# Patient Record
Sex: Female | Born: 1960 | Race: White | Hispanic: No | Marital: Married | Smoking: Former smoker
Health system: Southern US, Community
[De-identification: ages and names within clinical notes are randomized; demographics above are authoritative.]

## PROBLEM LIST (undated history)

## (undated) DIAGNOSIS — F32A Depression, unspecified: Secondary | ICD-10-CM

## (undated) DIAGNOSIS — E11319 Type 2 diabetes mellitus with unspecified diabetic retinopathy without macular edema: Secondary | ICD-10-CM

## (undated) DIAGNOSIS — E119 Type 2 diabetes mellitus without complications: Secondary | ICD-10-CM

## (undated) DIAGNOSIS — E118 Type 2 diabetes mellitus with unspecified complications: Secondary | ICD-10-CM

## (undated) DIAGNOSIS — E079 Disorder of thyroid, unspecified: Secondary | ICD-10-CM

## (undated) HISTORY — PX: FOOT SURGERY: SHX648

## (undated) HISTORY — DX: Type 2 diabetes mellitus with unspecified complications: E11.8

## (undated) HISTORY — DX: Type 2 diabetes mellitus without complications: E11.9

## (undated) HISTORY — DX: Type 2 diabetes mellitus with unspecified diabetic retinopathy without macular edema: E11.319

## (undated) HISTORY — DX: Disorder of thyroid, unspecified: E07.9

## (undated) HISTORY — DX: Depression, unspecified: F32.A

---

## 1999-02-18 ENCOUNTER — Encounter (INDEPENDENT_AMBULATORY_CARE_PROVIDER_SITE_OTHER): Payer: Self-pay

## 1999-02-18 ENCOUNTER — Ambulatory Visit (HOSPITAL_COMMUNITY): Admission: RE | Admit: 1999-02-18 | Discharge: 1999-02-18 | Payer: Self-pay | Admitting: *Deleted

## 2000-05-20 ENCOUNTER — Encounter (INDEPENDENT_AMBULATORY_CARE_PROVIDER_SITE_OTHER): Payer: Self-pay | Admitting: *Deleted

## 2000-05-20 ENCOUNTER — Other Ambulatory Visit: Admission: RE | Admit: 2000-05-20 | Discharge: 2000-05-20 | Payer: Self-pay | Admitting: *Deleted

## 2000-05-30 ENCOUNTER — Other Ambulatory Visit: Admission: RE | Admit: 2000-05-30 | Discharge: 2000-05-30 | Payer: Self-pay | Admitting: *Deleted

## 2000-07-28 ENCOUNTER — Encounter: Admission: RE | Admit: 2000-07-28 | Discharge: 2000-07-28 | Payer: Self-pay | Admitting: *Deleted

## 2000-10-18 ENCOUNTER — Encounter: Payer: Self-pay | Admitting: Internal Medicine

## 2000-10-18 ENCOUNTER — Encounter: Admission: RE | Admit: 2000-10-18 | Discharge: 2000-10-18 | Payer: Self-pay | Admitting: Internal Medicine

## 2003-11-08 ENCOUNTER — Encounter: Admission: RE | Admit: 2003-11-08 | Discharge: 2003-11-08 | Payer: Self-pay | Admitting: Internal Medicine

## 2004-10-06 ENCOUNTER — Ambulatory Visit (HOSPITAL_BASED_OUTPATIENT_CLINIC_OR_DEPARTMENT_OTHER): Admission: RE | Admit: 2004-10-06 | Discharge: 2004-10-06 | Payer: Self-pay | Admitting: Gynecology

## 2004-10-06 ENCOUNTER — Encounter (INDEPENDENT_AMBULATORY_CARE_PROVIDER_SITE_OTHER): Payer: Self-pay | Admitting: Specialist

## 2007-07-30 ENCOUNTER — Emergency Department (HOSPITAL_COMMUNITY): Admission: EM | Admit: 2007-07-30 | Discharge: 2007-07-30 | Payer: Self-pay | Admitting: Emergency Medicine

## 2009-07-10 ENCOUNTER — Emergency Department (HOSPITAL_COMMUNITY): Admission: EM | Admit: 2009-07-10 | Discharge: 2009-07-10 | Payer: Self-pay | Admitting: Family Medicine

## 2010-07-17 NOTE — H&P (Signed)
Kathy Ramirez, Kathy Ramirez                 ACCOUNT NO.:  [id number]   MEDICAL RECORD NO.:  [id number]          PATIENT TYPE:  AMB   LOCATION:  [facility]                         FACILITY:  [facility]   PHYSICIAN:  Timothy P. Fontaine, M.D.DATE OF BIRTH:  [birth year]   DATE OF ADMISSION:  10/06/2004  DATE OF DISCHARGE:                                HISTORY & PHYSICAL   She is being admitted for outpatient surgery, [facility],  August8, 2006 at 8:30.   CHIEF COMPLAINT:  Irregular heavy bleeding.   HISTORY OF PRESENT ILLNESS:  A 50 year old, G2, P2 female, status post tubal  sterilization, who was initially evaluated, having not been seen in the  office for over three years, due to heavy irregular bleeding.  The patient's  outpatient evaluation included a normal prolactin, FSH and TSH, negative  HCG, and a hemoglobin of 14.  Outpatient ultrasonography was suboptimal due  to both her bleeding as well as her abdominal girth.  Her endometrial stripe  was 13-mm.  Uterus overall appeared to be normal.  Right ovary is not  visualized, left was grossly normal with a thin physiologic 22-mm type cyst.  Due to her heavy bleeding, she was begun on Megace 40 mg b.i.d. and is  admitted for hysteroscopy D&C.  She apparently had an outpatient [place] for  endometrial sampling several years ago and it caused her incredible pain,  and she refuses any outpatient evaluation that requires endometrial  sampling.  She is being followed for diabetes and hypothyroidism.  She  recently was seen by her physician, Dr. Dimas Ramirez, who felt that she  was in poor control, but felt that it was appropriate to proceed with  hysteroscopy as control will not be predicted for some time, and her  endometrium needs to be evaluated.   PAST MEDICAL HISTORY:  1.  Diabetes.  2.  Hypothyroidism.   PAST SURGICAL HISTORY:  1.  Foot surgery.  2.  Cesarean section.  3.  BTL.   CURRENT MEDICATIONS:  1.  Synthroid 100  mcg daily.  2.  Glipizide/Metformin 2.5/500 daily.   ALLERGIES:  No reported medications.   REVIEW OF SYSTEMS:  Noncontributory.   FAMILY HISTORY:  Noncontributory.   SOCIAL HISTORY:  Noncontributory.   PHYSICAL EXAMINATION:  HEENT:  Normal.  LUNGS:  Clear.  CARDIAC:  Regular rate.  No rubs, murmurs, or gallops.  ABDOMEN:  Obese.  No gross palpable masses.  PELVIC:  External BUS, vagina normal. Cervix grossly normal.  Uterus without  gross abnormalities.  Adnexa without gross masses.  Limited by abdominal  girth.   ASSESSMENT/PLAN:  A 50 year old, G2, P2 female, status post tubal  sterilization with menometrorrhagia.  Ultrasound, uterus grossly normal,  endometrium at 13-mm, for hysteroscopy, endometrial sampling.   The patient is currently on Megace for menses control.  Currently being  followed for diabetes and hypothyroidism.  Her most recent labs show poor  diabetic control, elevated TSH, per Dr. Pincus Sanes evaluation.  He felt she  would be okay for hysteroscopy and we will plan on endometrial sampling to  rule out significant  hyperplasia or possible malignancy.  Labs were repeated  prior to surgery.  They are pending at the time of the dictation and we will  reevaluate the patient and re-discuss a.m. of surgery which will be  documented in a separate written statement the morning of surgery.       TPF/MEDQ  D:  10/02/2004  T:  10/02/2004  Job:  [id number]

## 2010-07-17 NOTE — Op Note (Signed)
NAMESERENNA, Kathy Ramirez                 ACCOUNT NO.:  [id number]   MEDICAL RECORD NO.:  [id number]          PATIENT TYPE:  AMB   LOCATION:  [facility]                         FACILITY:  [facility]   PHYSICIAN:  Timothy P. Fontaine, M.D.DATE OF BIRTH:  [birth year]   DATE OF PROCEDURE:  10/06/2004  DATE OF DISCHARGE:                                 OPERATIVE REPORT   PREOPERATIVE DIAGNOSES:  1.  Irregular bleeding.  2.  Menorrhagia.   POSTOPERATIVE DIAGNOSIS:  Endometrial hyperplasia, rule out carcinoma.   PROCEDURE:  Hysteroscopy D&C.   SURGEON:  Timothy P. Fontaine, M.D.   ANESTHESIA:  General.   ESTIMATED BLOOD LOSS:  Minimal.   COMPLICATIONS:  None.   SORBITOL DISCREPANCY:  Minimal.   SPECIMEN:  Endometrial curetting.   FINDINGS:  EUA without gross masses. Hysteroscopic shows a hyperplastic  pattern with multiple polyps. Endometrial overgrowth, bridging type of  growth, with grossly atypical vessels. Post D&C showed an empty cavity. No  remaining abnormalities. The tubal ostia not visualized bilaterally.  Otherwise, hysteroscopy was adequate noting fundus, anterior/posterior  surfaces, lower uterine segment, endocervical canal all visualized.   PROCEDURE:  The patient was taken to the operating room, underwent  anesthesia, placed in low dorsal lithotomy position, received a perineal  vaginal preparation with Betadine solution. Bladder emptied with in-and-out  Foley catheterization. EUA performed and the patient draped in the usual  fashion. Cervix was visualized with a weighted speculum. Anterior lip  grasped with single-tooth tenaculum and the cervix was gently gradually  dilated to admit the operative hysteroscope. Hysteroscopy was performed with  findings noted above. A sharp curettage was then performed with abundant  endometrium retrieved. Re-hysteroscopy showed an empty cavity. No remaining  abnormalities, good distention, no evidence of perforation. The instruments  were  removed. Adequate hemostasis was visualized. The patient placed in the  supine position, awakened without difficulty, and taken to recovery room in  good condition having tolerated the procedure well.       TPF/MEDQ  D:  10/06/2004  T:  10/06/2004  Job:  [id number]

## 2010-11-25 LAB — URINALYSIS, ROUTINE W REFLEX MICROSCOPIC
Ketones, ur: 15 — AB
Leukocytes, UA: NEGATIVE
Nitrite: NEGATIVE
Protein, ur: NEGATIVE
Urobilinogen, UA: 0.2

## 2010-12-01 ENCOUNTER — Other Ambulatory Visit: Payer: Self-pay | Admitting: Family Medicine

## 2010-12-01 DIAGNOSIS — R7989 Other specified abnormal findings of blood chemistry: Secondary | ICD-10-CM

## 2010-12-03 ENCOUNTER — Ambulatory Visit
Admission: RE | Admit: 2010-12-03 | Discharge: 2010-12-03 | Disposition: A | Discharge status: Home / Self Care | Payer: BC Managed Care – PPO | Source: Ambulatory Visit | Attending: Family Medicine | Admitting: Family Medicine

## 2010-12-03 DIAGNOSIS — R7989 Other specified abnormal findings of blood chemistry: Secondary | ICD-10-CM

## 2013-09-18 ENCOUNTER — Other Ambulatory Visit: Payer: Self-pay | Admitting: Physician Assistant

## 2013-09-18 DIAGNOSIS — R1012 Left upper quadrant pain: Secondary | ICD-10-CM

## 2013-09-21 ENCOUNTER — Ambulatory Visit
Admission: RE | Admit: 2013-09-21 | Discharge: 2013-09-21 | Disposition: A | Discharge status: Home / Self Care | Payer: BC Managed Care – PPO | Source: Ambulatory Visit | Attending: Physician Assistant | Admitting: Physician Assistant

## 2013-09-21 DIAGNOSIS — R1012 Left upper quadrant pain: Secondary | ICD-10-CM

## 2013-09-21 MED ORDER — IOHEXOL 300 MG/ML  SOLN
125.0000 mL | Freq: Once | INTRAMUSCULAR | Status: AC | PRN
  Administered 2013-09-21: 125 mL via INTRAVENOUS

## 2013-09-27 ENCOUNTER — Encounter (HOSPITAL_COMMUNITY): Payer: BC Managed Care – PPO

## 2013-12-26 ENCOUNTER — Other Ambulatory Visit: Payer: Self-pay | Admitting: Gastroenterology

## 2015-07-11 DIAGNOSIS — Z23 Encounter for immunization: Secondary | ICD-10-CM | POA: Diagnosis not present

## 2015-10-02 IMAGING — CT CT ABDOMEN W/ CM
2 of 5 series · 14 of 36 positions shown, 19 images · IV contrast (READICAT/WATER & [ID] OMNI 300)
Comparison: None.

CLINICAL DATA: Left upper quadrant pain for 6-8 weeks.

EXAM:
CT ABDOMEN WITH CONTRAST
TECHNIQUE: Multidetector CT imaging of the abdomen was performed using the
standard protocol following bolus administration of intravenous
contrast.
CONTRAST:  125mL OMNIPAQUE IOHEXOL 300 MG/ML  SOLN

[Series 601: coronal body · coronal · 0.92mm/px · 1 of 149 slices shown, 2 images]
[im 50/149  soft-tissue]
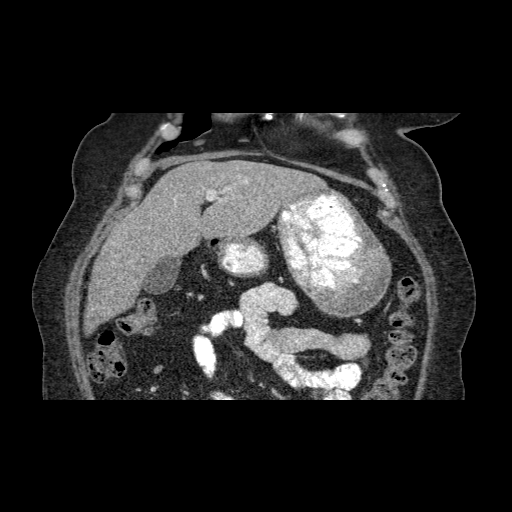
[im 50/149  bone]
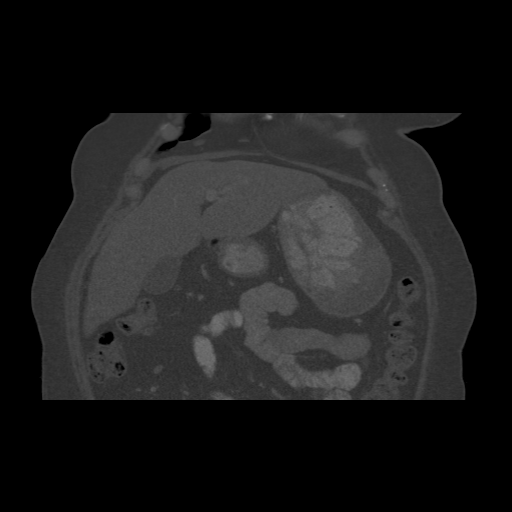

[Series 602: sagittal body · sagittal · 0.92mm/px · 13 of 180 slices shown, 17 images]
[im 10/180  soft-tissue]
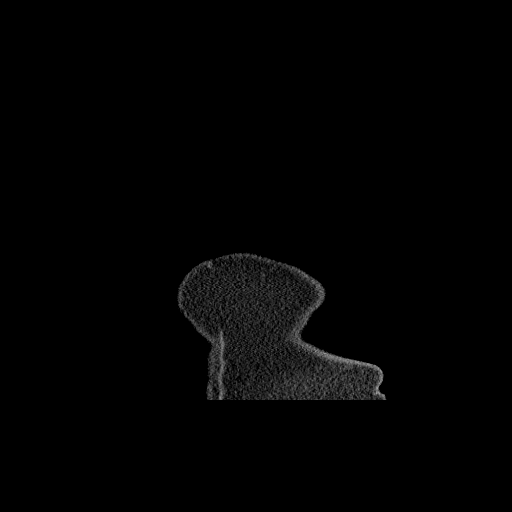
[im 10/180  lung]
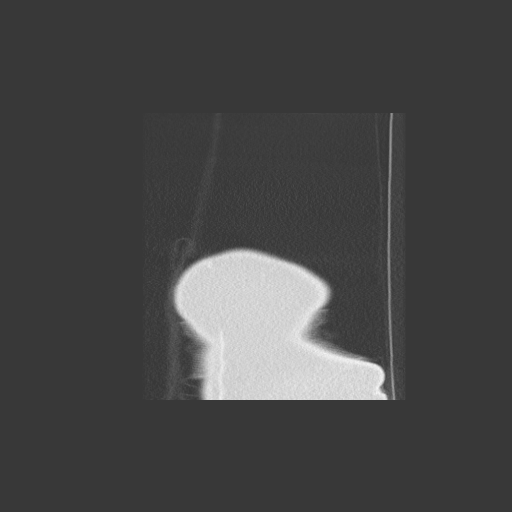
[im 10/180  bone]
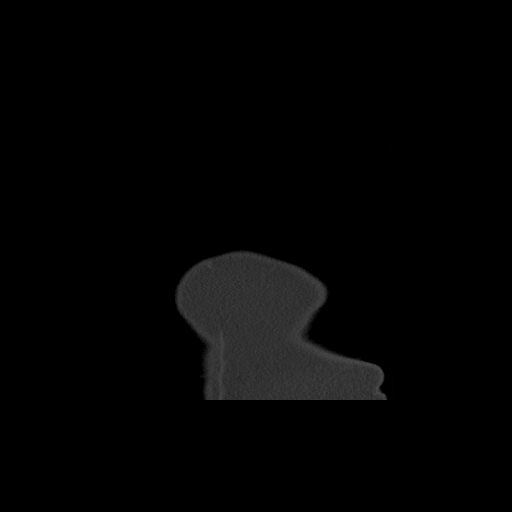
[im 19/180  lung]
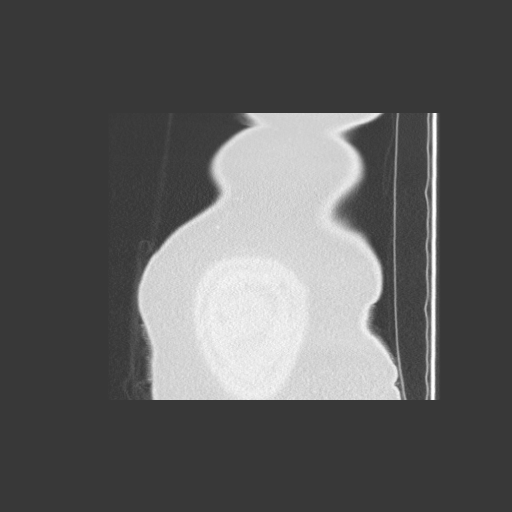
[im 29/180  soft-tissue]
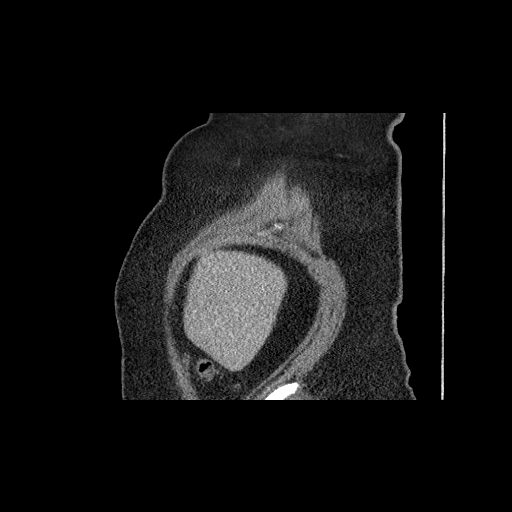
[im 29/180  lung]
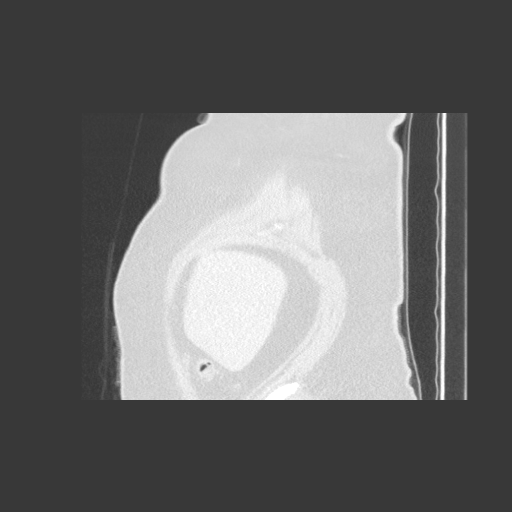
[im 38/180  lung]
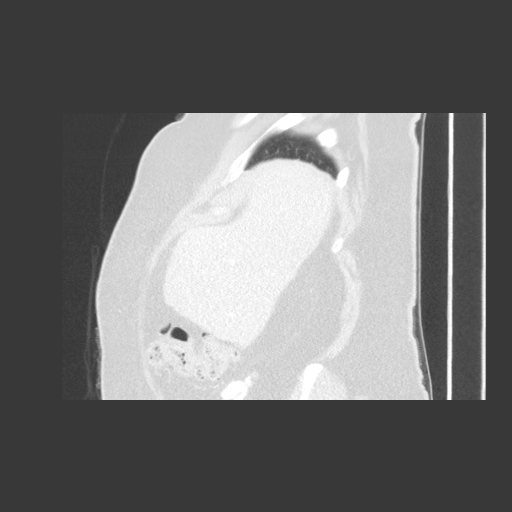
[im 48/180  soft-tissue]
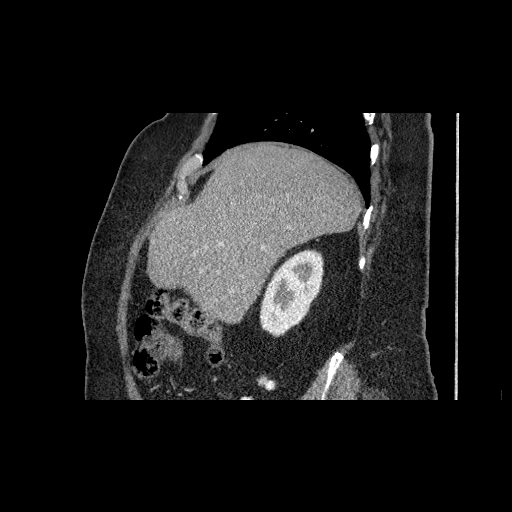
[im 57/180  soft-tissue]
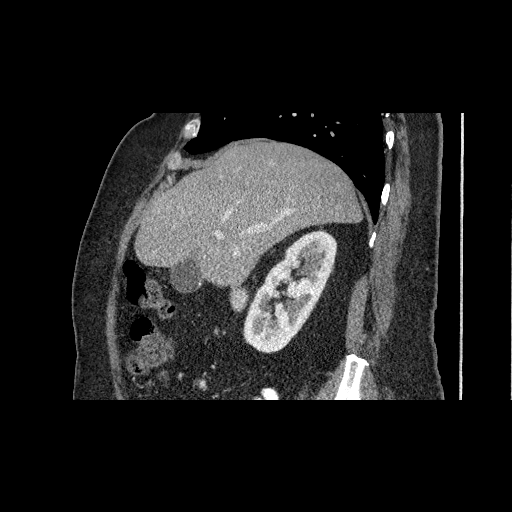
[im 76/180  soft-tissue]
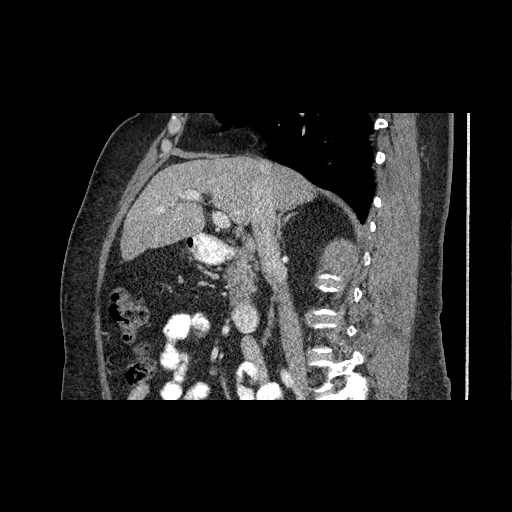
[im 95/180  soft-tissue]
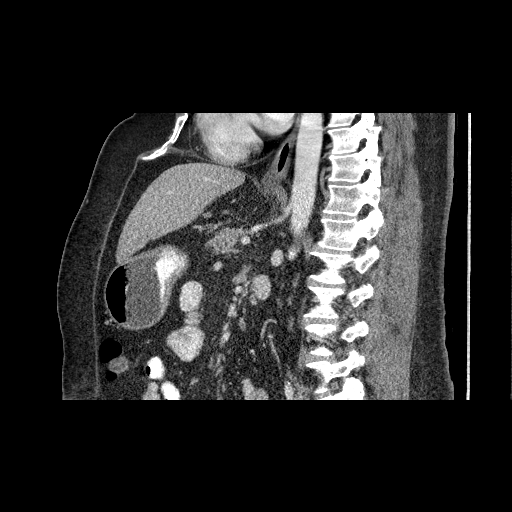
[im 104/180  soft-tissue]
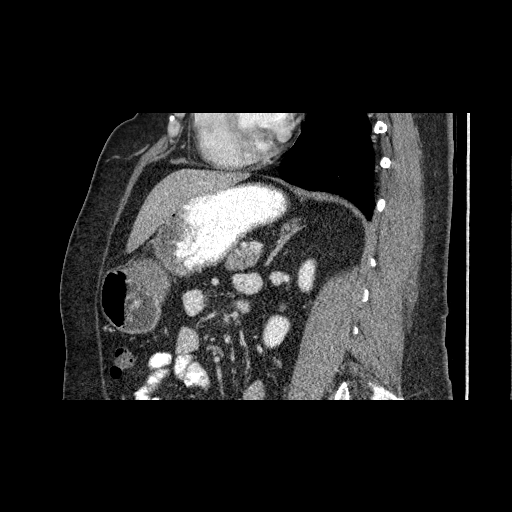
[im 123/180  soft-tissue]
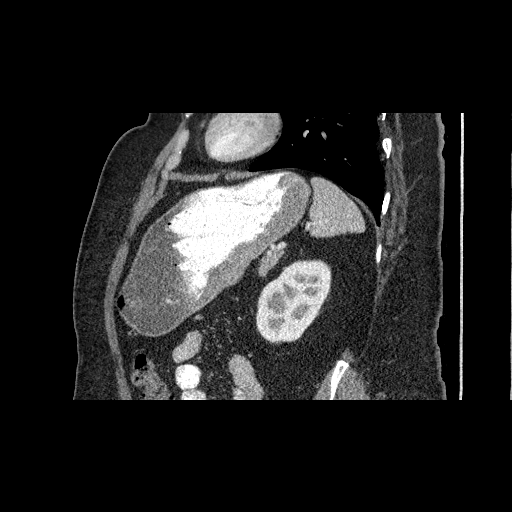
[im 132/180  soft-tissue]
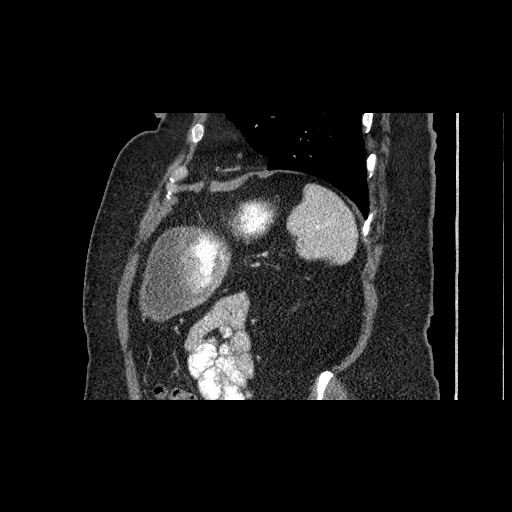
[im 132/180  bone]
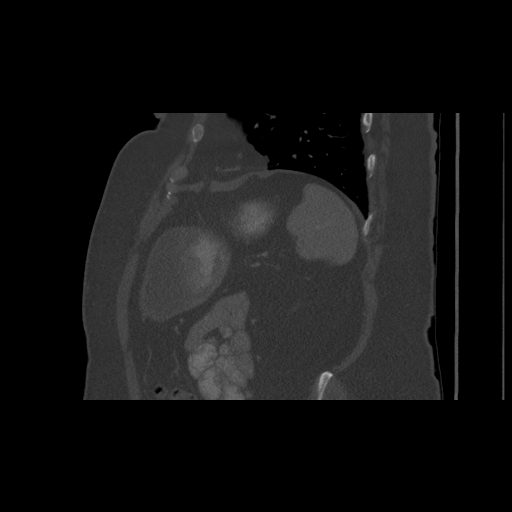
[im 151/180  soft-tissue]
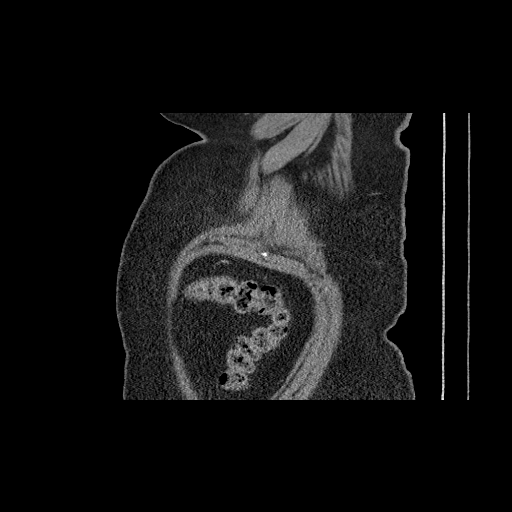
[im 170/180  soft-tissue]
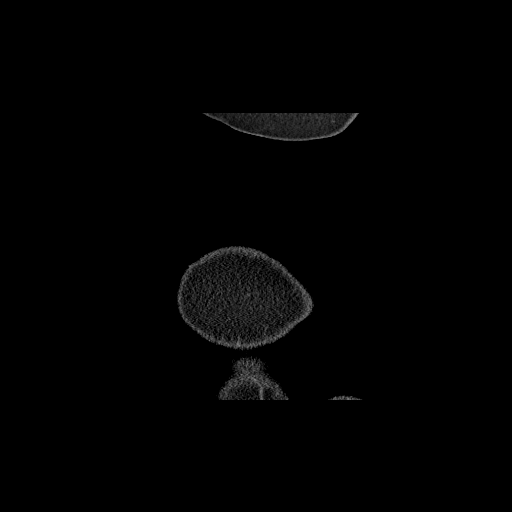

[14 of 36 positions shown; findings below may reference images not displayed]

FINDINGS: There is no pleural effusion.  The lung bases are clear.

No liver abnormality identified. Multiple small stones are
identified within the gallbladder. No biliary dilatation. There is a
normal appearance of the pancreas. The spleen is unremarkable.

Normal appearance of both adrenal glands. The kidneys both appear
normal.

Normal caliber of the abdominal aorta. There is mild calcified
atherosclerotic changes. No pathologically enlarged retroperitoneal
lymph nodes. No enlarged mesenteric lymph nodes.

No free fluid or fluid collections identified.

The stomach is normal. The small bowel loops have a normal course
and caliber.

No free fluid or fluid collections identified within the abdomen or
pelvis. The stomach appears normal. The small bowel loops have a
normal caliber there is no specific feature to suggest obstruction.
The visualized portions of the colon are normal.

Review of the visualized osseous structures is significant for mild
degenerative change within the lower thoracic and upper lumbar
spine.
IMPRESSION: 1. No findings within the upper abdomen to explain patient's left
upper quadrant pain.
2. Gallstones.

## 2016-05-17 DIAGNOSIS — E669 Obesity, unspecified: Secondary | ICD-10-CM | POA: Diagnosis not present

## 2016-05-17 DIAGNOSIS — E063 Autoimmune thyroiditis: Secondary | ICD-10-CM | POA: Diagnosis not present

## 2016-05-17 DIAGNOSIS — Z5181 Encounter for therapeutic drug level monitoring: Secondary | ICD-10-CM | POA: Diagnosis not present

## 2016-05-17 DIAGNOSIS — E785 Hyperlipidemia, unspecified: Secondary | ICD-10-CM | POA: Diagnosis not present

## 2016-05-17 DIAGNOSIS — E1142 Type 2 diabetes mellitus with diabetic polyneuropathy: Secondary | ICD-10-CM | POA: Diagnosis not present

## 2016-05-17 DIAGNOSIS — Z79899 Other long term (current) drug therapy: Secondary | ICD-10-CM | POA: Diagnosis not present

## 2016-05-17 DIAGNOSIS — E039 Hypothyroidism, unspecified: Secondary | ICD-10-CM | POA: Diagnosis not present

## 2016-05-31 ENCOUNTER — Ambulatory Visit: Payer: BLUE CROSS/BLUE SHIELD | Admitting: Rehabilitation

## 2016-06-24 DIAGNOSIS — E039 Hypothyroidism, unspecified: Secondary | ICD-10-CM | POA: Diagnosis not present

## 2016-06-24 DIAGNOSIS — E1142 Type 2 diabetes mellitus with diabetic polyneuropathy: Secondary | ICD-10-CM | POA: Diagnosis not present

## 2016-06-24 DIAGNOSIS — E063 Autoimmune thyroiditis: Secondary | ICD-10-CM | POA: Diagnosis not present

## 2016-06-24 DIAGNOSIS — E669 Obesity, unspecified: Secondary | ICD-10-CM | POA: Diagnosis not present

## 2016-08-25 DIAGNOSIS — E1142 Type 2 diabetes mellitus with diabetic polyneuropathy: Secondary | ICD-10-CM | POA: Diagnosis not present

## 2016-08-25 DIAGNOSIS — E063 Autoimmune thyroiditis: Secondary | ICD-10-CM | POA: Diagnosis not present

## 2016-08-25 DIAGNOSIS — E669 Obesity, unspecified: Secondary | ICD-10-CM | POA: Diagnosis not present

## 2016-08-25 DIAGNOSIS — E039 Hypothyroidism, unspecified: Secondary | ICD-10-CM | POA: Diagnosis not present

## 2016-08-27 ENCOUNTER — Ambulatory Visit (INDEPENDENT_AMBULATORY_CARE_PROVIDER_SITE_OTHER): Payer: BLUE CROSS/BLUE SHIELD

## 2016-08-27 ENCOUNTER — Encounter: Payer: Self-pay | Admitting: Podiatry

## 2016-08-27 ENCOUNTER — Ambulatory Visit (INDEPENDENT_AMBULATORY_CARE_PROVIDER_SITE_OTHER): Payer: BLUE CROSS/BLUE SHIELD | Admitting: Podiatry

## 2016-08-27 DIAGNOSIS — L03119 Cellulitis of unspecified part of limb: Secondary | ICD-10-CM

## 2016-08-27 DIAGNOSIS — L02619 Cutaneous abscess of unspecified foot: Secondary | ICD-10-CM

## 2016-08-27 DIAGNOSIS — R609 Edema, unspecified: Secondary | ICD-10-CM

## 2016-08-27 MED ORDER — CEPHALEXIN 500 MG PO CAPS: 500.0000 mg | ORAL_CAPSULE | Freq: Three times a day (TID) | ORAL | 1 refills | Status: DC

## 2016-08-27 NOTE — Progress Notes (Signed)
Subjective:    Patient ID: Kathy Ramirez, female   DOB: 56 y.o.   MRN: [id number]   HPI patient states that she's had discoloration in the plantar left ball for the last week and she does not remember specific injury and she does have diabetes that has been in reasonable control    Review of Systems  All other systems reviewed and are negative.       Objective:  Physical Exam  Constitutional: She is oriented to person, place, and time.  Cardiovascular: Intact distal pulses.   Musculoskeletal: Normal range of motion.  Neurological: She is alert and oriented to person, place, and time.  Skin: Skin is warm and dry.  Nursing note and vitals reviewed.  neurovascular status found to be intact muscle strength adequate range of motion within normal limits with patient noted to have keratotic lesion sub-left third metatarsal localized in nature with no proximal edema erythema or drainage noted. Patient does have diminishment of sharp [employer] vibratory and her sugars been running in the 180-200 range but she's not sure of her A1c     Assessment:   Localized infection of the left plantar foot with neuropathy and long-term diabetes is complicating factors      Plan:   H&P and x-rays of both feet reviewed. Patient has had previous elevating osteotomies but not I believe where she's having this problem and ultimately that might be necessary. At this point I recommended we focused on this area and using sharp sterile his mentation I debrided the tissue found to be localized with no drainage noted and I flushed the area and applied Iodosorb with sterile dressing and padding around it and I will see her back again in 1 week. Also placed her on cephalexin 500 mg 3 times a day I gave strict instructions if she should develop any systemic signs of infection or proximal redness she is to go straight to the emergency room  X-ray bilateral was negative for signs of ostial lysis with screws in the second fourth  metatarsal left and the lesser metatarsal right with no other pathology noted

## 2016-08-27 NOTE — Progress Notes (Signed)
   Subjective:    Patient ID: Kathy Ramirez, female    DOB: 12/18/1960, 56 y.o.   MRN: [id number]  HPI Chief Complaint  Patient presents with  . Diabetic Ulcer    Lt ball of the foot is painful/hard skin is sore and Rt foot have discoloration for about 1 week      Review of Systems  Musculoskeletal: Positive for back pain, gait problem and joint swelling.  Skin: Positive for color change.       Objective:   Physical Exam        Assessment & Plan:

## 2016-09-03 ENCOUNTER — Encounter: Payer: Self-pay | Admitting: Podiatry

## 2016-09-03 ENCOUNTER — Ambulatory Visit (INDEPENDENT_AMBULATORY_CARE_PROVIDER_SITE_OTHER): Payer: BLUE CROSS/BLUE SHIELD | Admitting: Podiatry

## 2016-09-03 DIAGNOSIS — L02619 Cutaneous abscess of unspecified foot: Secondary | ICD-10-CM | POA: Diagnosis not present

## 2016-09-03 DIAGNOSIS — E1149 Type 2 diabetes mellitus with other diabetic neurological complication: Secondary | ICD-10-CM | POA: Diagnosis not present

## 2016-09-03 DIAGNOSIS — E1140 Type 2 diabetes mellitus with diabetic neuropathy, unspecified: Secondary | ICD-10-CM | POA: Diagnosis not present

## 2016-09-03 DIAGNOSIS — L03119 Cellulitis of unspecified part of limb: Secondary | ICD-10-CM | POA: Diagnosis not present

## 2016-09-03 DIAGNOSIS — L97521 Non-pressure chronic ulcer of other part of left foot limited to breakdown of skin: Secondary | ICD-10-CM

## 2016-09-03 NOTE — Progress Notes (Signed)
Subjective:    Patient ID: Kathy Ramirez, female   DOB: 56 y.o.   MRN: [id number]   HPI patient states the left foot is doing better but the skin is still getting thicker and is still discolored but I'm not having the same amount of pain    ROS      Objective:  Physical Exam neuro status was found to be diminished with long-term diabetes with vascular status intact with severe keratotic lesion sub-third metatarsal left with slight drainage still noted but no proximal edema erythema noted     Assessment:    Doing much better having ulceration left plantar foot     Plan:    With sterile his mentation debrided the lesion completely with no drainage noted and I advised long-term that she would do best in the diabetic shoe given her advanced diabetes neuropathy and ulceration. Patient will be given approval for this and that hopefully will have diabetic shoes made

## 2016-10-06 ENCOUNTER — Ambulatory Visit: Payer: BLUE CROSS/BLUE SHIELD

## 2016-10-13 ENCOUNTER — Telehealth: Payer: Self-pay | Admitting: Podiatry

## 2016-10-13 NOTE — Telephone Encounter (Signed)
Pt called and is scheduled to see her pcp but asked if she needed to see her doctor that treats her diabetes.I explained that if Dr Sharl MaKerr treats her for her diabetes that he would have to sign off on the paperwork,Pt is scheduled to see him in sept and  is going to see if she can see him sooner and will let us know so we can fax paperwork to Dr Sharl MaKerr.

## 2016-10-15 DIAGNOSIS — E039 Hypothyroidism, unspecified: Secondary | ICD-10-CM | POA: Diagnosis not present

## 2016-10-18 ENCOUNTER — Other Ambulatory Visit: Payer: Self-pay | Admitting: Family Medicine

## 2016-10-18 DIAGNOSIS — I999 Unspecified disorder of circulatory system: Secondary | ICD-10-CM | POA: Diagnosis not present

## 2016-10-18 DIAGNOSIS — M21969 Unspecified acquired deformity of unspecified lower leg: Secondary | ICD-10-CM | POA: Diagnosis not present

## 2016-10-18 DIAGNOSIS — R1084 Generalized abdominal pain: Secondary | ICD-10-CM | POA: Diagnosis not present

## 2016-10-18 DIAGNOSIS — E1142 Type 2 diabetes mellitus with diabetic polyneuropathy: Secondary | ICD-10-CM | POA: Diagnosis not present

## 2016-10-19 ENCOUNTER — Telehealth: Payer: Self-pay | Admitting: Podiatry

## 2016-10-19 NOTE — Telephone Encounter (Signed)
Pt called and seen her pcp Dr Lupe Carney this week but when asking pt she stated Dr Talmage Coin is the doctor that treats her diabetes. She said she has seen him in the last couple of months. Can we please fax diabetic shoe paperwork to Dr Sharl Ma so pt can get her shoes.

## 2016-10-28 ENCOUNTER — Ambulatory Visit
Admission: RE | Admit: 2016-10-28 | Discharge: 2016-10-28 | Disposition: A | Discharge status: Home / Self Care | Payer: BLUE CROSS/BLUE SHIELD | Source: Ambulatory Visit | Attending: Family Medicine | Admitting: Family Medicine

## 2016-10-28 DIAGNOSIS — E11621 Type 2 diabetes mellitus with foot ulcer: Secondary | ICD-10-CM | POA: Diagnosis not present

## 2016-10-28 DIAGNOSIS — I999 Unspecified disorder of circulatory system: Secondary | ICD-10-CM

## 2016-10-29 ENCOUNTER — Encounter: Payer: Self-pay | Admitting: Podiatry

## 2016-10-29 ENCOUNTER — Other Ambulatory Visit: Payer: Self-pay | Admitting: Podiatry

## 2016-10-29 ENCOUNTER — Ambulatory Visit (INDEPENDENT_AMBULATORY_CARE_PROVIDER_SITE_OTHER): Payer: BLUE CROSS/BLUE SHIELD

## 2016-10-29 ENCOUNTER — Ambulatory Visit (INDEPENDENT_AMBULATORY_CARE_PROVIDER_SITE_OTHER): Payer: BLUE CROSS/BLUE SHIELD | Admitting: Podiatry

## 2016-10-29 DIAGNOSIS — L97521 Non-pressure chronic ulcer of other part of left foot limited to breakdown of skin: Secondary | ICD-10-CM | POA: Diagnosis not present

## 2016-10-29 DIAGNOSIS — M216X9 Other acquired deformities of unspecified foot: Secondary | ICD-10-CM

## 2016-10-29 DIAGNOSIS — M79672 Pain in left foot: Secondary | ICD-10-CM

## 2016-10-29 NOTE — Progress Notes (Signed)
Subjective:    Patient ID: Kathy Ramirez, female   DOB: 56 y.o.   MRN: [id number]   HPI patient presents stating this area has broken down again and I'm still waiting for my diabetic shoes    ROS      Objective:  Physical Exam neurovascular status unchanged with patient having reoccurrence of moderate ulceration plantar aspect left third metatarsal with localized redness no odor or active drainage noted. Patient is waiting for diabetic shoes which we will be able to modify     Assessment:   Reoccurrence of ulceration of the plantar left foot third metatarsal with the area being approximately 1 cm x 1 cm with 3 mm of depth      Plan:   Reviewed condition and using sterile sharp instrumentation debridement accomplished flushed the area and dispensed a surgical shoe with a wedge so there is no forefoot pressure and then discussed the diabetic shoes and the fact this may need to go to the OR and may eventually require elevating osteotomy or so may require a vigorous debridement  X-rays were negative for signs of osteomyelitis or soft tissue buckling and did indicate previous osteotomy second and fourth metatarsals

## 2016-12-13 ENCOUNTER — Other Ambulatory Visit: Payer: Self-pay | Admitting: Podiatry

## 2016-12-13 ENCOUNTER — Ambulatory Visit (INDEPENDENT_AMBULATORY_CARE_PROVIDER_SITE_OTHER): Payer: BLUE CROSS/BLUE SHIELD

## 2016-12-13 ENCOUNTER — Ambulatory Visit (INDEPENDENT_AMBULATORY_CARE_PROVIDER_SITE_OTHER): Payer: BLUE CROSS/BLUE SHIELD | Admitting: Podiatry

## 2016-12-13 DIAGNOSIS — L97522 Non-pressure chronic ulcer of other part of left foot with fat layer exposed: Secondary | ICD-10-CM | POA: Diagnosis not present

## 2016-12-13 DIAGNOSIS — E0842 Diabetes mellitus due to underlying condition with diabetic polyneuropathy: Secondary | ICD-10-CM

## 2016-12-13 DIAGNOSIS — L97529 Non-pressure chronic ulcer of other part of left foot with unspecified severity: Secondary | ICD-10-CM

## 2016-12-13 DIAGNOSIS — I70245 Atherosclerosis of native arteries of left leg with ulceration of other part of foot: Secondary | ICD-10-CM

## 2016-12-13 DIAGNOSIS — M79672 Pain in left foot: Secondary | ICD-10-CM

## 2016-12-13 MED ORDER — SULFAMETHOXAZOLE-TRIMETHOPRIM 800-160 MG PO TABS: 1.0000 | ORAL_TABLET | Freq: Two times a day (BID) | ORAL | 0 refills | Status: DC

## 2016-12-14 ENCOUNTER — Telehealth: Payer: Self-pay | Admitting: Podiatry

## 2016-12-14 MED ORDER — DOXYCYCLINE HYCLATE 100 MG PO CAPS
100.0000 mg | ORAL_CAPSULE | Freq: Two times a day (BID) | ORAL | 0 refills | Status: DC
Start: 2016-12-14 — End: 2017-03-23

## 2016-12-14 NOTE — Telephone Encounter (Signed)
I told pt that her symptoms may not be related to the antibiotic, that she may be having a heart problem and she should go to the ED or call her PCP. Pt stated she would call her PCP. I told her I would contact Dr.Evans for alternate antibiotic and explain what is going on with pt currently.

## 2016-12-14 NOTE — Addendum Note (Signed)
Addended by: Alphia Kava D on: 12/14/2016 01:57 PM   Modules accepted: Orders

## 2016-12-14 NOTE — Telephone Encounter (Signed)
I called new antibiotic orders doxycycline to pt and she stated she was waiting for a call back from her PCP.

## 2016-12-14 NOTE — Telephone Encounter (Signed)
I saw Dr. Logan Bores yesterday and he prescribed me a high power antibiotic to take twice daily. I took it after dinner last night but it has made me feel funny and I think I might need a different antibiotic. It caused me to have a bad headache, and my heart is beating slightly fast but feels like it was pounding in my chest all night, which caused me to wake up about every hour. I almost have a sick feeling, like I could throw up. Please call me back at [phone].

## 2016-12-14 NOTE — Telephone Encounter (Signed)
Discontinue the Bactrim. Rx for doxycycline100mg  #20 BID. Thanks, Dr. Logan Bores

## 2016-12-15 NOTE — Progress Notes (Signed)
   Subjective:  Patient with a history of diabetes mellitus presents today for follow-up evaluation of an ulceration to the left foot secondary to diabetes mellitus. She reports significant pain and states there appears to be blood underneath the area. She reports associated swelling. She is concerned for infection. She denies any modifying factors.  Chest reports a new complaint of swelling and a rash to the left lower leg. She has not done anything to treat the area. Patient presents today for further treatment and evaluation.   Past Medical History:  Diagnosis Date  . Diabetes mellitus without complication (HCC)       Objective/Physical Exam General: The patient is alert and oriented x3 in no acute distress.  Dermatology:  Wound #1 noted to the left foot measuring 2.5 x 2.0 x 0.3 cm (LxWxD).   To the noted ulceration(s), there is no eschar. There is a moderate amount of slough, fibrin, and necrotic tissue noted. Granulation tissue and wound base is red. There is a minimal amount of serosanguineous drainage noted. There is no exposed bone muscle-tendon ligament or joint. There is no malodor. Periwound integrity is intact. Skin is warm, dry and supple bilateral lower extremities.  Vascular: Palpable pedal pulses bilaterally. No edema or erythema noted. Capillary refill within normal limits.  Neurological: Epicritic and protective threshold absent bilaterally.   Musculoskeletal Exam: Range of motion within normal limits to all pedal and ankle joints bilateral. Muscle strength 5/5 in all groups bilateral.   Assessment: #1 ulceration of the left foot secondary to diabetes mellitus #2 diabetes mellitus w/ peripheral neuropathy   Plan of Care:  #1 Patient was evaluated. #2 medically necessary excisional debridement including muscle and deep fascial tissue was performed using a tissue nipper and a chisel blade. Excisional debridement of all the necrotic nonviable tissue down to healthy  bleeding viable tissue was performed with post-debridement measurements same as pre-. #3 The wound was cleansed and dry sterile dressing applied. #4 culture taken from the wound. Prescription for Bactrim DS given to patient. #5 recommend an iodine and dry sterile dressing daily. #6 patient is getting diabetic shoes with Raiford Nobleick, Pedorthist. #7 return to clinic in 3 weeks with Dr. Charlsie Merlesegal.   Felecia ShellingBrent M. Sihaam Chrobak, DPM [facility]  Dr. Felecia ShellingBrent M. Vinette Crites, DPM    [address]                                        [place], [place] [address]                Office [phone]  Fax [phone]

## 2016-12-16 ENCOUNTER — Telehealth: Payer: Self-pay | Admitting: Podiatry

## 2016-12-16 LAB — WOUND CULTURE
MICRO NUMBER:: 81146646
SPECIMEN QUALITY: ADEQUATE

## 2016-12-16 NOTE — Telephone Encounter (Signed)
Entered in error

## 2016-12-27 ENCOUNTER — Ambulatory Visit: Payer: BLUE CROSS/BLUE SHIELD | Admitting: Orthotics

## 2016-12-27 DIAGNOSIS — M79673 Pain in unspecified foot: Secondary | ICD-10-CM

## 2016-12-27 DIAGNOSIS — I70245 Atherosclerosis of native arteries of left leg with ulceration of other part of foot: Secondary | ICD-10-CM

## 2016-12-27 DIAGNOSIS — L97522 Non-pressure chronic ulcer of other part of left foot with fat layer exposed: Secondary | ICD-10-CM

## 2016-12-27 DIAGNOSIS — E0842 Diabetes mellitus due to underlying condition with diabetic polyneuropathy: Secondary | ICD-10-CM

## 2016-12-27 NOTE — Progress Notes (Signed)
Patient came in today to pick up diabetic shoes and custom inserts.  Same was well pleased with fit and function.   The patient could ambulate without any discomfort; there were no signs of any quality issues. The foot ortheses offered full contact with plantar surface and contoured the arch well.   The shoes fit well with no heel slippage and areas of pressure concern.   Patient advised to contact us if any problems arise.  Patient also advised on how to report any issues.   Cash price due to no coverage insurance:  $250

## 2017-01-05 ENCOUNTER — Ambulatory Visit (INDEPENDENT_AMBULATORY_CARE_PROVIDER_SITE_OTHER): Payer: BLUE CROSS/BLUE SHIELD | Admitting: Podiatry

## 2017-01-05 ENCOUNTER — Ambulatory Visit: Payer: BLUE CROSS/BLUE SHIELD | Admitting: Orthotics

## 2017-01-05 ENCOUNTER — Encounter: Payer: Self-pay | Admitting: Podiatry

## 2017-01-05 DIAGNOSIS — I70245 Atherosclerosis of native arteries of left leg with ulceration of other part of foot: Secondary | ICD-10-CM

## 2017-01-05 DIAGNOSIS — E0842 Diabetes mellitus due to underlying condition with diabetic polyneuropathy: Secondary | ICD-10-CM

## 2017-01-05 DIAGNOSIS — M79672 Pain in left foot: Secondary | ICD-10-CM

## 2017-01-05 DIAGNOSIS — L97521 Non-pressure chronic ulcer of other part of left foot limited to breakdown of skin: Secondary | ICD-10-CM

## 2017-01-05 DIAGNOSIS — L97522 Non-pressure chronic ulcer of other part of left foot with fat layer exposed: Secondary | ICD-10-CM

## 2017-01-05 DIAGNOSIS — L03119 Cellulitis of unspecified part of limb: Secondary | ICD-10-CM

## 2017-01-05 DIAGNOSIS — L02619 Cutaneous abscess of unspecified foot: Secondary | ICD-10-CM

## 2017-01-05 NOTE — Patient Instructions (Signed)
Instructions for Wound Care  The most important step to healing a foot wound is to reduce the pressure on your foot - it is extremely important to stay off your foot as much as possible and wear the shoe/boot as instructed.  Cleanse your foot with saline wash or warm soapy water (dial antibacterial soap or similar).  Blot dry.  Apply prescribed medication to your wound and cover with gauze and a bandage.  May hold bandage in place with Coban (self sticky wrap), Ace bandage or tape.  You may find dressing supplies at your local [employer], [employer], drug store or medical supply store.  Your prescribed topical medication is :    [employer] is a mail order medical supply company that we use to provide some of our would care products.  If we use their service of you, you will receive the product by mail.  If you have not received the medication in 3 business days, please call our office.  If you notice any foul odor, increase in pain, pus, increased swelling, red streaks or generalized redness occurring in your foot or leg-Call our office immediately to be seen.  This may be a sign of a limb or life threatening infection that will need prompt attention.  Marlowe AschoffKathryn Egerton, DPM  [facility]  [phone] [place]  [phone] [place]

## 2017-01-05 NOTE — Progress Notes (Signed)
Reordered shoes, patient complaint was that her edema caused discomfort in mary jane style... Reordered apex two strap velco.

## 2017-01-07 NOTE — Progress Notes (Signed)
Subjective:    Patient ID: Rolm BookbinderJoli T Ekstrom, female   DOB: [birth year] y.o.   MRN: [id number]   HPI patient states she seems to be improved quite a bit and has been evaluated for diabetic shoes    ROS      Objective:  Physical Exam neurovascular status unchanged with keratotic lesion subsecond fourth metatarsal right localized in nature with no active drainage noted     Assessment:   Ulcer the right plantar foot that is more superficial it was previously with no indications currently of fat exposure      Plan:    Flushed the area out applied medication debrided tissue and instructed on soaks and continued padding therapy. Patient be seen back to recheck and earlier if any redness drainage or other changes in the plantar site should occur

## 2017-01-17 ENCOUNTER — Ambulatory Visit: Payer: BLUE CROSS/BLUE SHIELD | Admitting: Orthotics

## 2017-03-23 ENCOUNTER — Ambulatory Visit: Payer: BLUE CROSS/BLUE SHIELD | Admitting: Podiatry

## 2017-03-23 ENCOUNTER — Encounter: Payer: Self-pay | Admitting: Podiatry

## 2017-03-23 DIAGNOSIS — L97522 Non-pressure chronic ulcer of other part of left foot with fat layer exposed: Secondary | ICD-10-CM | POA: Diagnosis not present

## 2017-03-23 DIAGNOSIS — E0843 Diabetes mellitus due to underlying condition with diabetic autonomic (poly)neuropathy: Secondary | ICD-10-CM

## 2017-03-23 DIAGNOSIS — I70245 Atherosclerosis of native arteries of left leg with ulceration of other part of foot: Secondary | ICD-10-CM

## 2017-03-23 MED ORDER — DOXYCYCLINE HYCLATE 100 MG PO CAPS: 100.0000 mg | ORAL_CAPSULE | Freq: Two times a day (BID) | ORAL | 0 refills | Status: DC

## 2017-03-23 MED ORDER — GENTAMICIN SULFATE 0.1 % EX CREA: 1.0000 "application " | TOPICAL_CREAM | Freq: Three times a day (TID) | CUTANEOUS | 1 refills | Status: DC

## 2017-03-26 LAB — WOUND CULTURE
MICRO NUMBER:: 90096691
SPECIMEN QUALITY:: ADEQUATE

## 2017-03-26 NOTE — Progress Notes (Signed)
   Subjective:  Patient with a history of diabetes mellitus presents today for evaluation of a worsening callus lesion of the left forefoot that has been present for a few weeks. She reports associated pain and states there is blood collecting underneath the callus. There are no modifying factors noted. Patient presents today for further treatment and evaluation.   Past Medical History:  Diagnosis Date  . Diabetes mellitus without complication (HCC)       Objective/Physical Exam General: The patient is alert and oriented x3 in no acute distress.  Dermatology:  Wound #1 noted to the left foot measuring 1.5 x 1.0 x 0.2 cm (LxWxD).   To the noted ulceration(s), there is no eschar. There is a moderate amount of slough, fibrin, and necrotic tissue noted. Granulation tissue and wound base is red. There is a minimal amount of serosanguineous drainage noted. There is no exposed bone muscle-tendon ligament or joint. There is moderate malodor. Periwound is callused. Skin is warm, dry and supple bilateral lower extremities.  Vascular: Palpable pedal pulses bilaterally. No edema or erythema noted. Capillary refill within normal limits.  Neurological: Epicritic and protective threshold absent bilaterally.   Musculoskeletal Exam: Range of motion within normal limits to all pedal and ankle joints bilateral. Muscle strength 5/5 in all groups bilateral.   Assessment: #1 ulceration to left foot secondary to diabetes mellitus #2 diabetes mellitus w/ peripheral neuropathy   Plan of Care:  #1 Patient was evaluated. #2 medically necessary excisional debridement including subcutaneous tissue was performed using a tissue nipper and a chisel blade. Excisional debridement of all the necrotic nonviable tissue down to healthy bleeding viable tissue was performed with post-debridement measurements same as pre-. #3 the wound was cleansed and dry sterile dressing applied. #4 Prescription for gentamicin cream  provided to patient. #5 Prescription for Doxycycline #28 provided to patient.  #6 Cultures taken from wound. #7 Post op shoe dispensed. Weightbearing as tolerated. #8 Orders for HgA1c placed today. Patient needs to follow up with Dr. Sharl MaKerr, PCP. #9 Return to clinic in 3 weeks.   Felecia ShellingBrent M. Kaige Whistler, DPM [facility]  Dr. Felecia ShellingBrent M. Eduard Penkala, DPM    [address]                                        [place], [place] [address]                Office [phone]  Fax [phone]

## 2017-04-07 DIAGNOSIS — E0843 Diabetes mellitus due to underlying condition with diabetic autonomic (poly)neuropathy: Secondary | ICD-10-CM | POA: Diagnosis not present

## 2017-04-07 DIAGNOSIS — I70245 Atherosclerosis of native arteries of left leg with ulceration of other part of foot: Secondary | ICD-10-CM | POA: Diagnosis not present

## 2017-04-08 LAB — HEMOGLOBIN A1C
EAG (MMOL/L): 17 (calc)
Hgb A1c MFr Bld: 12.3 % of total Hgb — ABNORMAL HIGH (ref <5.7)
MEAN PLASMA GLUCOSE: 306 (calc)

## 2017-04-18 ENCOUNTER — Telehealth: Payer: Self-pay | Admitting: Podiatry

## 2017-04-18 ENCOUNTER — Ambulatory Visit (INDEPENDENT_AMBULATORY_CARE_PROVIDER_SITE_OTHER): Payer: BLUE CROSS/BLUE SHIELD | Admitting: Podiatry

## 2017-04-18 DIAGNOSIS — I70245 Atherosclerosis of native arteries of left leg with ulceration of other part of foot: Secondary | ICD-10-CM

## 2017-04-18 DIAGNOSIS — L97522 Non-pressure chronic ulcer of other part of left foot with fat layer exposed: Secondary | ICD-10-CM

## 2017-04-18 DIAGNOSIS — E0843 Diabetes mellitus due to underlying condition with diabetic autonomic (poly)neuropathy: Secondary | ICD-10-CM | POA: Diagnosis not present

## 2017-04-18 MED ORDER — GABAPENTIN 100 MG PO CAPS: 100.0000 mg | ORAL_CAPSULE | Freq: Three times a day (TID) | ORAL | 3 refills | Status: DC

## 2017-04-18 NOTE — Telephone Encounter (Signed)
Gabapentin 100mg . I just sent it in. Thanks, Dr. Logan BoresEvans

## 2017-04-18 NOTE — Telephone Encounter (Signed)
I was there this morning and saw Dr. Logan BoresEvans. He stated while I was there that he was going to prescribe a nerve medication for me. [facility] and they did not get. I didn't know if he remembered to do that or not. If you want to give me a call back on it I sure would appreciate it. My number is [phone]. Thank you very much and have a wonderful day.

## 2017-04-18 NOTE — Telephone Encounter (Signed)
Dr.Evans, what medication did you want to prescribe?

## 2017-04-19 ENCOUNTER — Telehealth: Payer: Self-pay | Admitting: *Deleted

## 2017-04-19 NOTE — Telephone Encounter (Signed)
Pt states the prescription Dr. Logan BoresEvans was to send to the pharmacy is not there.

## 2017-04-19 NOTE — Telephone Encounter (Signed)
I informed pt the Gabapentin was confirmed received by [facility] 04/18/2017 at 5:41pm.

## 2017-04-20 NOTE — Progress Notes (Signed)
   Subjective:  Patient with a history of diabetes mellitus presents today for follow up evaluation of a painful ulceration to the left foot. She reports associated drainage and states a callus has formed over the area. She has been using gentamicin cream daily as directed. Patient presents today for further treatment and evaluation.   Past Medical History:  Diagnosis Date  . Diabetes mellitus without complication (HCC)       Objective/Physical Exam General: The patient is alert and oriented x3 in no acute distress.  Dermatology:  Wound #1 noted to the left foot measuring 1.0 x 1.0 x 0.2 cm (LxWxD).   To the noted ulceration(s), there is no eschar. There is a moderate amount of slough, fibrin, and necrotic tissue noted. Granulation tissue and wound base is red. There is a minimal amount of serosanguineous drainage noted. There is no exposed bone muscle-tendon ligament or joint. There is moderate malodor. Periwound is callused. Skin is warm, dry and supple bilateral lower extremities.  Vascular: Palpable pedal pulses bilaterally. No edema or erythema noted. Capillary refill within normal limits.  Neurological: Epicritic and protective threshold absent bilaterally.   Musculoskeletal Exam: Range of motion within normal limits to all pedal and ankle joints bilateral. Muscle strength 5/5 in all groups bilateral.   Assessment: #1 ulceration to left foot secondary to diabetes mellitus #2 diabetes mellitus w/ peripheral neuropathy   Plan of Care:  #1 Patient was evaluated. Cultures reviewed.  #2 medically necessary excisional debridement including subcutaneous tissue was performed using a tissue nipper and a chisel blade. Excisional debridement of all the necrotic nonviable tissue down to healthy bleeding viable tissue was performed with post-debridement measurements same as pre-. #3 the wound was cleansed and dry sterile dressing applied. #4 Continue using gentamicin cream daily with a  dry sterile dressing. #5 Continue wearing post op shoe.  #6 Prescription for gabapentin 100 mg to be taken at bedtime provided to patient.  #7 Spoke with patient regarding elevated A1c levels (12.3 mg/dL). Stressed importance of decreasing sugar levels.  #8 Return to clinic in 3 weeks.   Felecia ShellingBrent M. Evans, DPM [facility]  Dr. Felecia ShellingBrent M. Evans, DPM    [address]                                        [place], [place] [address]                Office [phone]  Fax [phone]

## 2017-05-09 ENCOUNTER — Ambulatory Visit: Payer: BLUE CROSS/BLUE SHIELD | Admitting: Podiatry

## 2017-06-01 ENCOUNTER — Ambulatory Visit (INDEPENDENT_AMBULATORY_CARE_PROVIDER_SITE_OTHER): Payer: BLUE CROSS/BLUE SHIELD | Admitting: Podiatry

## 2017-06-01 DIAGNOSIS — B351 Tinea unguium: Secondary | ICD-10-CM

## 2017-06-01 DIAGNOSIS — L97522 Non-pressure chronic ulcer of other part of left foot with fat layer exposed: Secondary | ICD-10-CM

## 2017-06-01 DIAGNOSIS — M79676 Pain in unspecified toe(s): Secondary | ICD-10-CM

## 2017-06-01 DIAGNOSIS — E0843 Diabetes mellitus due to underlying condition with diabetic autonomic (poly)neuropathy: Secondary | ICD-10-CM | POA: Diagnosis not present

## 2017-06-01 DIAGNOSIS — I70245 Atherosclerosis of native arteries of left leg with ulceration of other part of foot: Secondary | ICD-10-CM

## 2017-06-03 NOTE — Progress Notes (Signed)
   Subjective:  Patient with a history of diabetes mellitus presents today for follow up evaluation of an ulceration of the left foot. She states the wound has worsened since her last visit. She has been using the gentamicin cream daily as directed with a dry sterile dressing.  Patient also complains of elongated, thickened nails of bilateral feet that cause pain while ambulating in shoes. She is unable to trim her own nails. Patient presents today for further treatment and evaluation.   Past Medical History:  Diagnosis Date  . Diabetes mellitus without complication (HCC)       Objective/Physical Exam General: The patient is alert and oriented x3 in no acute distress.  Dermatology:  Wound #1 noted to the left foot measuring 1.2 x 0.7 x 0.2 cm (LxWxD).   To the noted ulceration(s), there is no eschar. There is a moderate amount of slough, fibrin, and necrotic tissue noted. Granulation tissue and wound base is red. There is a minimal amount of serosanguineous drainage noted. There is no exposed bone muscle-tendon ligament or joint. There is moderate malodor. Periwound is callused. Nails are tender, long, thickened and dystrophic with subungual debris, consistent with onychomycosis, 1-5 bilateral. No signs of infection noted. Skin is warm, dry and supple bilateral lower extremities.  Vascular: Palpable pedal pulses bilaterally. No edema or erythema noted. Capillary refill within normal limits.  Neurological: Epicritic and protective threshold absent bilaterally.   Musculoskeletal Exam: Range of motion within normal limits to all pedal and ankle joints bilateral. Muscle strength 5/5 in all groups bilateral.   Assessment: #1 ulceration to left foot secondary to diabetes mellitus #2 diabetes mellitus w/ peripheral neuropathy #3 Onychomycosis of nail due to dermatophyte bilateral  Plan of Care:  #1 Patient was evaluated.  #2 Medically necessary excisional debridement including subcutaneous  tissue was performed using a tissue nipper and a chisel blade. Excisional debridement of all the necrotic nonviable tissue down to healthy bleeding viable tissue was performed with post-debridement measurements same as pre-. #3 The wound was cleansed and dry sterile dressing applied. #4 Continue using gentamicin cream daily with a dry sterile dressing.  #5 Continue wearing post op shoe.  #6 Instructed to maintain good pedal hygiene and foot care. Stressed importance of controlling blood sugar.  #7 Mechanical debridement of nails 1-5 bilaterally performed using a nail nipper. Filed with dremel without incident.  #8 Return to clinic in 6 weeks.    Felecia ShellingBrent M. Lexton Hidalgo, DPM [facility]  Dr. Felecia ShellingBrent M. Brenee Gajda, DPM    [address]                                        [place], [place] [address]                Office [phone]) [phone]  Fax [phone](336) [phone]

## 2017-07-08 ENCOUNTER — Ambulatory Visit (HOSPITAL_COMMUNITY)
Admission: EM | Admit: 2017-07-08 | Discharge: 2017-07-08 | Disposition: A | Discharge status: Home / Self Care | Payer: BLUE CROSS/BLUE SHIELD | Attending: Family Medicine | Admitting: Family Medicine

## 2017-07-08 ENCOUNTER — Encounter (HOSPITAL_COMMUNITY): Payer: Self-pay | Admitting: Family Medicine

## 2017-07-08 DIAGNOSIS — B9789 Other viral agents as the cause of diseases classified elsewhere: Secondary | ICD-10-CM

## 2017-07-08 DIAGNOSIS — J069 Acute upper respiratory infection, unspecified: Secondary | ICD-10-CM | POA: Diagnosis not present

## 2017-07-08 MED ORDER — IPRATROPIUM BROMIDE 0.06 % NA SOLN: 2.0000 | Freq: Four times a day (QID) | NASAL | 0 refills | Status: AC

## 2017-07-08 MED ORDER — TRIAMCINOLONE ACETONIDE 55 MCG/ACT NA AERO: 2.0000 | INHALATION_SPRAY | Freq: Every day | NASAL | 12 refills | Status: DC

## 2017-07-08 MED ORDER — DOXYCYCLINE HYCLATE 100 MG PO CAPS: 100.0000 mg | ORAL_CAPSULE | Freq: Two times a day (BID) | ORAL | 0 refills | Status: DC

## 2017-07-08 MED ORDER — BENZONATATE 100 MG PO CAPS: 100.0000 mg | ORAL_CAPSULE | Freq: Three times a day (TID) | ORAL | 0 refills | Status: DC

## 2017-07-08 MED ORDER — CETIRIZINE HCL 10 MG PO TABS: 10.0000 mg | ORAL_TABLET | Freq: Every day | ORAL | 0 refills | Status: DC

## 2017-07-08 NOTE — ED Provider Notes (Signed)
[facility]    CSN: [id number] Arrival date & time: 07/08/17  1320     History   Chief Complaint Chief Complaint  Patient presents with  . Cough    HPI Kathy Ramirez is a 57 y.o. female.   57 year old female with history of uncontrolled diabetes comes in for 6-day history of URI symptoms.  Has had headache, sore throat, nasal congestion, rhinorrhea, ear pain, cough.  States cough is minimally productive.  Denies known fever, but has had hot and cold chills.  Has been taking OTC cold medication without relief.  She is a former smoker, 30-45 pack-year history.  Denies chest pain, shortness of breath, wheezing, palpitation.     Past Medical History:  Diagnosis Date  . Diabetes mellitus without complication (HCC)     There are no active problems to display for this patient.   History reviewed. No pertinent surgical history.  OB History   None      Home Medications    Prior to Admission medications   Medication Sig Start Date End Date Taking? Authorizing Provider  benzonatate (TESSALON) 100 MG capsule Take 1 capsule (100 mg total) by mouth every 8 (eight) hours. 07/08/17   Cathie Hoops, Melanee Cordial V, PA-C  cetirizine (ZYRTEC) 10 MG tablet Take 1 tablet (10 mg total) by mouth daily. 07/08/17   Cathie Hoops, Deklen Popelka V, PA-C  doxycycline (VIBRAMYCIN) 100 MG capsule Take 1 capsule (100 mg total) by mouth 2 (two) times daily. Fill on 5/14 if not improving 07/12/17   Cathie Hoops, Ridhi Hoffert V, PA-C  gabapentin (NEURONTIN) 100 MG capsule Take 1 capsule (100 mg total) by mouth 3 (three) times daily. 04/18/17   Felecia Shelling, DPM  gentamicin cream (GARAMYCIN) 0.1 % Apply 1 application topically 3 (three) times daily. 03/23/17   Felecia Shelling, DPM  ipratropium (ATROVENT) 0.06 % nasal spray Place 2 sprays into both nostrils 4 (four) times daily. 07/08/17   Cathie Hoops, Ellesse Antenucci V, PA-C  levothyroxine (SYNTHROID, LEVOTHROID) 75 MCG tablet 1 tablet every morning on an empty stomach Once a day Orally 30 days 07/05/16   [provider]  NOVOLOG FLEXPEN 100 UNIT/ML FlexPen  07/23/16   [provider]  sulfamethoxazole-trimethoprim (BACTRIM DS,SEPTRA DS) 800-160 MG tablet Take 1 tablet by mouth 2 (two) times daily. 12/13/16   Felecia Shelling, DPM  TOUJEO SOLOSTAR 300 UNIT/ML SOPN  07/23/16   [provider]  triamcinolone (NASACORT) 55 MCG/ACT AERO nasal inhaler Place 2 sprays into the nose daily. 07/08/17   Belinda Fisher, PA-C    Family History History reviewed. No pertinent family history.  Social History Social History   Tobacco Use  . Smoking status: Former Games developer  . Smokeless tobacco: Never Used  . Tobacco comment: quit 2012  Substance Use Topics  . Alcohol use: No  . Drug use: No     Allergies   Septra ds [sulfamethoxazole-trimethoprim]   Review of Systems Review of Systems  Reason unable to perform ROS: See HPI as above.     Physical Exam Triage Vital Signs ED Triage Vitals  Enc Vitals Group     BP 07/08/17 1342 125/71     Pulse Rate 07/08/17 1342 84     Resp 07/08/17 1342 18     Temp 07/08/17 1342 99.1 F (37.3 C)     Temp Source 07/08/17 1342 Oral     SpO2 07/08/17 1342 95 %     Weight --      Height --  Head Circumference --      Peak Flow --      Pain Score 07/08/17 1340 0     Pain Loc --      Pain Edu? --      Excl. in GC? --    No data found.  Updated Vital Signs BP 125/71   Pulse 84   Temp 99.1 F (37.3 C) (Oral)   Resp 18   SpO2 95%   Physical Exam  Constitutional: She is oriented to person, place, and time. She appears well-developed and well-nourished. No distress.  HENT:  Head: Normocephalic and atraumatic.  Right Ear: External ear and ear canal normal. Tympanic membrane is not erythematous and not bulging. A middle ear effusion is present.  Left Ear: External ear and ear canal normal. Tympanic membrane is not erythematous and not bulging. A middle ear effusion is present.  Nose: Mucosal edema and rhinorrhea present. Right sinus exhibits maxillary  sinus tenderness and frontal sinus tenderness. Left sinus exhibits maxillary sinus tenderness and frontal sinus tenderness.  Mouth/Throat: Uvula is midline, oropharynx is clear and moist and mucous membranes are normal.  Eyes: Pupils are equal, round, and reactive to light. Conjunctivae are normal.  Neck: Normal range of motion. Neck supple.  Cardiovascular: Normal rate, regular rhythm and normal heart sounds. Exam reveals no gallop and no friction rub.  No murmur heard. Pulmonary/Chest: Effort normal and breath sounds normal. She has no decreased breath sounds. She has no wheezes. She has no rhonchi. She has no rales.  Lymphadenopathy:    She has no cervical adenopathy.  Neurological: She is alert and oriented to person, place, and time.  Skin: Skin is warm and dry.  Psychiatric: She has a normal mood and affect. Her behavior is normal. Judgment normal.     UC Treatments / Results  Labs (all labs ordered are listed, but only abnormal results are displayed) Labs Reviewed - No data to display  Lab Results  Component Value Date   HGBA1C 12.3 (H) 04/07/2017     EKG None  Radiology No results found.  Procedures Procedures (including critical care time)  Medications Ordered in UC Medications - No data to display  Initial Impression / Assessment and Plan / UC Course  I have reviewed the triage vital signs and the nursing notes.  Pertinent labs & imaging results that were available during my care of the patient were reviewed by me and considered in my medical decision making (see chart for details).    Discussed with patient history and exam most consistent with viral URI.  Patient nontoxic in appearance.  Given uncontrolled diabetes, last A1c 12.3, will defer prednisone. Symptomatic treatment as needed. Push fluids. Rx of doxycycline sent to the pharmacy, can fill in 4 days if symptoms not improving to cover for sinusitis/bronchitis. Return precautions given. Patient expresses  understanding and agrees to plan.  Final Clinical Impressions(s) / UC Diagnoses   Final diagnoses:  Viral URI with cough    ED Prescriptions    Medication Sig Dispense Auth. Provider   triamcinolone (NASACORT) 55 MCG/ACT AERO nasal inhaler Place 2 sprays into the nose daily. 1 Inhaler Parthena Fergeson V, PA-C   ipratropium (ATROVENT) 0.06 % nasal spray Place 2 sprays into both nostrils 4 (four) times daily. 15 mL Hong Moring V, PA-C   benzonatate (TESSALON) 100 MG capsule Take 1 capsule (100 mg total) by mouth every 8 (eight) hours. 21 capsule Nerea Bordenave V, PA-C   cetirizine (ZYRTEC) 10 MG  tablet Take 1 tablet (10 mg total) by mouth daily. 15 tablet Jayana Kotula V, PA-C   doxycycline (VIBRAMYCIN) 100 MG capsule Take 1 capsule (100 mg total) by mouth 2 (two) times daily. Fill on 5/14 if not improving 20 capsule Threasa Alpha, [place] 07/08/17 1419

## 2017-07-08 NOTE — ED Triage Notes (Signed)
Pt here for sore throat, headache, cough, ear pain and  Chest congestion.

## 2017-07-08 NOTE — Discharge Instructions (Addendum)
Tessalon for cough. Start nasacort, atrovent nasal spray, zyrtec for nasal congestion/drainage. You can use over the counter nasal saline rinse such as neti pot for nasal congestion. As discussed, you can use afrin sparingly for the next 3 days to help with nasal congestion. Do not go over 3 days, as it can cause rebound congestion. Keep hydrated, your urine should be clear to pale yellow in color. Tylenol/motrin for fever and pain. Monitor for any worsening of symptoms, chest pain, shortness of breath, wheezing, swelling of the throat, follow up for reevaluation.    You can fill doxycycline on 5/14 if symptoms not improving to cover for sinus infection/bronchitis.   For sore throat/cough try using a honey-based tea. Use 3 teaspoons of honey with juice squeezed from half lemon. Place shaved pieces of ginger into 1/2-1 cup of water and warm over stove top. Then mix the ingredients and repeat every 4 hours as needed.

## 2017-07-18 ENCOUNTER — Other Ambulatory Visit: Payer: Self-pay | Admitting: Podiatry

## 2017-07-18 ENCOUNTER — Ambulatory Visit: Payer: BLUE CROSS/BLUE SHIELD | Admitting: Podiatry

## 2017-07-18 ENCOUNTER — Telehealth: Payer: Self-pay | Admitting: Podiatry

## 2017-07-18 DIAGNOSIS — E0843 Diabetes mellitus due to underlying condition with diabetic autonomic (poly)neuropathy: Secondary | ICD-10-CM

## 2017-07-18 DIAGNOSIS — L97522 Non-pressure chronic ulcer of other part of left foot with fat layer exposed: Secondary | ICD-10-CM | POA: Diagnosis not present

## 2017-07-18 DIAGNOSIS — I70245 Atherosclerosis of native arteries of left leg with ulceration of other part of foot: Secondary | ICD-10-CM

## 2017-07-18 MED ORDER — DOXYCYCLINE HYCLATE 100 MG PO CAPS: 100.0000 mg | ORAL_CAPSULE | Freq: Two times a day (BID) | ORAL | 0 refills | Status: DC

## 2017-07-20 NOTE — Progress Notes (Signed)
   Subjective:  57 year old female presenting today for follow up evaluation of an ulceration to the left foot secondary to diabetes mellitus. She reports severe soreness. She reports the wound came open three days ago and has had a moderate amount of drainage. She reports she is currently taking a course of Doxycycline. There are no modifying factors noted. Patient is here for further evaluation and treatment.   Past Medical History:  Diagnosis Date  . Diabetes mellitus without complication (HCC)       Objective/Physical Exam General: The patient is alert and oriented x3 in no acute distress.  Dermatology:  Wound #1 noted to the left foot measuring 2.5 x 1.0 x 0.3 cm (LxWxD).   To the noted ulceration(s), there is no eschar. There is a moderate amount of slough, fibrin, and necrotic tissue noted. Granulation tissue and wound base is red. There is a minimal amount of serosanguineous drainage noted. There is no exposed bone muscle-tendon ligament or joint. There is no malodor. Periwound integrity is intact. Skin is warm, dry and supple bilateral lower extremities.  Vascular: Palpable pedal pulses bilaterally. No edema or erythema noted. Capillary refill within normal limits.  Neurological: Epicritic and protective threshold diminished bilaterally.   Musculoskeletal Exam: Range of motion within normal limits to all pedal and ankle joints bilateral. Muscle strength 5/5 in all groups bilateral.   Assessment: #1 ulceration of the left foot secondary to diabetes mellitus #2 diabetes mellitus w/ peripheral neuropathy   Plan of Care:  #1 Patient was evaluated. #2 Medically necessary excisional debridement including muscle and deep fascial tissue was performed using a tissue nipper and a chisel blade. Excisional debridement of all the necrotic nonviable tissue down to healthy bleeding viable tissue was performed with post-debridement measurements same as pre-. #3 The wound was cleansed and  dry sterile dressing applied. #4 Culture taken from wound.  #5 Refill prescription for Doxycycline 100 mg #20 provided to patient.  #6 Recommended betadine daily with a dry sterile dressing.  #7 Continue wearing post op shoes.  #8 Return to clinic in 2 weeks.   Felecia Shelling, DPM [facility]  Dr. Felecia Shelling, DPM    [address]                                        [place], [place] [address]                Office [phone]  Fax [phone]

## 2017-07-21 LAB — WOUND CULTURE
MICRO NUMBER: 90612185
SPECIMEN QUALITY: ADEQUATE

## 2017-08-01 ENCOUNTER — Encounter: Payer: Self-pay | Admitting: Podiatry

## 2017-08-01 ENCOUNTER — Ambulatory Visit: Payer: BLUE CROSS/BLUE SHIELD | Admitting: Podiatry

## 2017-08-01 DIAGNOSIS — E0843 Diabetes mellitus due to underlying condition with diabetic autonomic (poly)neuropathy: Secondary | ICD-10-CM

## 2017-08-01 DIAGNOSIS — B351 Tinea unguium: Secondary | ICD-10-CM | POA: Diagnosis not present

## 2017-08-01 DIAGNOSIS — M79676 Pain in unspecified toe(s): Secondary | ICD-10-CM

## 2017-08-01 DIAGNOSIS — L97522 Non-pressure chronic ulcer of other part of left foot with fat layer exposed: Secondary | ICD-10-CM

## 2017-08-01 DIAGNOSIS — I70245 Atherosclerosis of native arteries of left leg with ulceration of other part of foot: Secondary | ICD-10-CM

## 2017-08-03 NOTE — Progress Notes (Signed)
   Subjective:  57 year old female presenting today for follow up evaluation of an ulceration to the left foot secondary to diabetes mellitus. She states the wound seems to be thickening. She has been using Betadine as directed. There are no modifying factors noted. Patient is here for further evaluation and treatment.   Past Medical History:  Diagnosis Date  . Diabetes mellitus without complication (HCC)       Objective/Physical Exam General: The patient is alert and oriented x3 in no acute distress.  Dermatology:  Wound #1 noted to the left foot measuring 0.2 x 0.2 x 0.1 cm (LxWxD).   To the noted ulceration(s), there is no eschar. There is a moderate amount of slough, fibrin, and necrotic tissue noted. Granulation tissue and wound base is red. There is a minimal amount of serosanguineous drainage noted. There is no exposed bone muscle-tendon ligament or joint. There is no malodor. Periwound integrity is intact. Nails are tender, long, thickened and dystrophic with subungual debris, consistent with onychomycosis, 1-5 bilateral. No signs of infection noted. Skin is warm, dry and supple bilateral lower extremities.  Vascular: Palpable pedal pulses bilaterally. No edema or erythema noted. Capillary refill within normal limits.  Neurological: Epicritic and protective threshold diminished bilaterally.   Musculoskeletal Exam: Range of motion within normal limits to all pedal and ankle joints bilateral. Muscle strength 5/5 in all groups bilateral.   Assessment: #1 ulceration of the left foot secondary to diabetes mellitus #2 diabetes mellitus w/ peripheral neuropathy #3 Onychomycosis of nail due to dermatophyte bilateral   Plan of Care:  #1 Patient was evaluated. #2 Medically necessary excisional debridement including subcutaneous tissue was performed using a tissue nipper and a chisel blade. Excisional debridement of all the necrotic nonviable tissue down to healthy bleeding viable  tissue was performed with post-debridement measurements same as pre-. #3 The wound was cleansed and dry sterile dressing applied. #4 Continue using gentamicin cream daily with a Band-Aid.  #5 Mechanical debridement of nails 1-5 bilaterally performed using a nail nipper. Filed with dremel without incident.  #6 Return to clinic in 4 weeks.     Felecia ShellingBrent M. Shaynna Husby, DPM [facility]  Dr. Felecia ShellingBrent M. Dat Derksen, DPM    [address]                                        [place], [place] [address]                Office [phone]) [phone]  Fax [phone]

## 2017-08-31 ENCOUNTER — Encounter: Payer: Self-pay | Admitting: Podiatry

## 2017-08-31 ENCOUNTER — Ambulatory Visit: Payer: BLUE CROSS/BLUE SHIELD | Admitting: Podiatry

## 2017-08-31 DIAGNOSIS — L97522 Non-pressure chronic ulcer of other part of left foot with fat layer exposed: Secondary | ICD-10-CM

## 2017-08-31 DIAGNOSIS — E0843 Diabetes mellitus due to underlying condition with diabetic autonomic (poly)neuropathy: Secondary | ICD-10-CM

## 2017-08-31 DIAGNOSIS — I70245 Atherosclerosis of native arteries of left leg with ulceration of other part of foot: Secondary | ICD-10-CM

## 2017-09-07 NOTE — Progress Notes (Signed)
   Subjective:  57 year old female presenting today for follow up evaluation of an ulceration to the left foot secondary to diabetes mellitus. She reports soreness of the foot with associated swelling. She states she has very dry skin on the foot. She has been applying gentamicin cream to the wound as directed as well as wearing the post op shoe. Patient is here for further evaluation and treatment.   Past Medical History:  Diagnosis Date  . Diabetes mellitus without complication (HCC)       Objective/Physical Exam General: The patient is alert and oriented x3 in no acute distress.  Dermatology:  Wound #1 noted to the left foot measuring 0.5 x 0.5 x 0.2 cm (LxWxD).   To the noted ulceration(s), there is no eschar. There is a moderate amount of slough, fibrin, and necrotic tissue noted. Granulation tissue and wound base is red. There is a minimal amount of serosanguineous drainage noted. There is no exposed bone muscle-tendon ligament or joint. There is no malodor. Periwound integrity is intact. Skin is warm, dry and supple bilateral lower extremities.  Vascular: Palpable pedal pulses bilaterally. No edema or erythema noted. Capillary refill within normal limits.  Neurological: Epicritic and protective threshold diminished bilaterally.   Musculoskeletal Exam: Range of motion within normal limits to all pedal and ankle joints bilateral. Muscle strength 5/5 in all groups bilateral.   Assessment: #1 ulceration of the left foot secondary to diabetes mellitus #2 diabetes mellitus w/ peripheral neuropathy   Plan of Care:  #1 Patient was evaluated. #2 Medically necessary excisional debridement including subcutaneous tissue was performed using a tissue nipper and a chisel blade. Excisional debridement of all the necrotic nonviable tissue down to healthy bleeding viable tissue was performed with post-debridement measurements same as pre-. #3 The wound was cleansed and dry sterile dressing  applied. #4 Continue using gentamicin cream daily with a Band-Aid.  #5 Continue using post op shoe.  #6 Return to clinic in 4 weeks.     Felecia ShellingBrent M. Rushawn Capshaw, DPM [facility]  Dr. Felecia ShellingBrent M. Rubby Barbary, DPM    [address]                                        [place], [place] [address]                Office [phone](336) [phone]  Fax [phone]([phone]

## 2017-10-05 ENCOUNTER — Ambulatory Visit: Payer: BLUE CROSS/BLUE SHIELD | Admitting: Podiatry

## 2017-10-17 ENCOUNTER — Ambulatory Visit: Payer: BLUE CROSS/BLUE SHIELD | Admitting: Podiatry

## 2017-10-17 DIAGNOSIS — L97522 Non-pressure chronic ulcer of other part of left foot with fat layer exposed: Secondary | ICD-10-CM | POA: Diagnosis not present

## 2017-10-17 DIAGNOSIS — I70245 Atherosclerosis of native arteries of left leg with ulceration of other part of foot: Secondary | ICD-10-CM | POA: Diagnosis not present

## 2017-10-17 DIAGNOSIS — E0843 Diabetes mellitus due to underlying condition with diabetic autonomic (poly)neuropathy: Secondary | ICD-10-CM

## 2017-10-19 NOTE — Progress Notes (Signed)
   Subjective:  [age] year old female presenting today for follow up evaluation of an ulceration to the left foot secondary to diabetes mellitus. She reports continued pain from the wound. She reports the wound bled for several days after her last appointment. She has been using Betadine as directed. Patient is here for further evaluation and treatment.   Past Medical History:  Diagnosis Date  . Diabetes mellitus without complication (HCC)       Objective/Physical Exam General: The patient is alert and oriented x3 in no acute distress.  Dermatology:  Wound #1 noted to the left foot measuring 1.0 x 1.0 x 0.2 cm (LxWxD).   To the noted ulceration(s), there is no eschar. There is a moderate amount of slough, fibrin, and necrotic tissue noted. Granulation tissue and wound base is red. There is a minimal amount of serosanguineous drainage noted. There is no exposed bone muscle-tendon ligament or joint. There is no malodor. Periwound integrity is intact. Skin is warm, dry and supple bilateral lower extremities.  Vascular: Palpable pedal pulses bilaterally. No edema or erythema noted. Capillary refill within normal limits.  Neurological: Epicritic and protective threshold diminished bilaterally.   Musculoskeletal Exam: Range of motion within normal limits to all pedal and ankle joints bilateral. Muscle strength 5/5 in all groups bilateral.   Assessment: #1 ulceration of the left foot secondary to diabetes mellitus #2 diabetes mellitus w/ peripheral neuropathy   Plan of Care:  #1 Patient was evaluated. #2 Medically necessary excisional debridement including subcutaneous tissue was performed using a tissue nipper and a chisel blade. Excisional debridement of all the necrotic nonviable tissue down to healthy bleeding viable tissue was performed with post-debridement measurements same as pre-. #3 The wound was cleansed and dry sterile dressing applied. #4 Continue using Betadine daily with a dry  sterile dressing.  #5 Continue weightbearing in a post op shoe.  #6 Patient has not followed up with her PCP or endocrinologist in over a year. Stressed importance of follow up with PCP to control her blood glucose levels.  #7 Return to clinic in 3 weeks.      Felecia ShellingBrent M. Lennin Osmond, DPM [facility]  Dr. Felecia ShellingBrent M. Olson Lucarelli, DPM    [address]                                        [place], [place] [address]                Office [phone]  Fax [phone]

## 2017-11-07 ENCOUNTER — Ambulatory Visit: Payer: BLUE CROSS/BLUE SHIELD | Admitting: Podiatry

## 2017-11-07 DIAGNOSIS — E0843 Diabetes mellitus due to underlying condition with diabetic autonomic (poly)neuropathy: Secondary | ICD-10-CM

## 2017-11-07 DIAGNOSIS — I70245 Atherosclerosis of native arteries of left leg with ulceration of other part of foot: Secondary | ICD-10-CM

## 2017-11-07 DIAGNOSIS — L989 Disorder of the skin and subcutaneous tissue, unspecified: Secondary | ICD-10-CM

## 2017-11-08 NOTE — Progress Notes (Signed)
   Subjective: 57 year old female with PMHx of DM presenting today for follow up evaluation of an ulceration of the left foot. She states the area has improved and the pain is not as significant. She has been using Betadine as directed. There are no modifying factors noted. Patient is here for further evaluation and treatment.   Past Medical History:  Diagnosis Date  . Diabetes mellitus without complication (HCC)      Objective:  Physical Exam General: Alert and oriented x3 in no acute distress  Dermatology: Hyperkeratotic lesion present on the left forefoot. Pain on palpation with a central nucleated core noted. Skin is warm, dry and supple bilateral lower extremities. Negative for open lesions or macerations.  Vascular: Palpable pedal pulses bilaterally. No edema or erythema noted. Capillary refill within normal limits.  Neurological: Epicritic and protective threshold grossly intact bilaterally.   Musculoskeletal Exam: Pain on palpation at the keratotic lesion noted. Range of motion within normal limits bilateral. Muscle strength 5/5 in all groups bilateral.  Assessment: 1. Ulceration of the left foot secondary to diabetes mellitus - healed 2. Pre-ulcerative callus lesion left forefoot 3. Diabetes mellitus with peripheral neuropathy    Plan of Care:  1. Patient evaluated 2. Excisional debridement of keratoic lesion using a chisel blade was performed without incident.  3. Dressed area with light dressing. 4. Resume wearing DM shoes.  5. Patient has not followed up with her PCP or endocrinologist in over a year. Stressed importance of follow up with PCP to control her blood glucose levels. 6. Patient is to return to the clinic in 4 weeks.   Felecia Shelling, DPM [facility]  Dr. Felecia Shelling, DPM    [address]                                        [place], [place] [address]                Office [phone]  Fax [phone]

## 2017-12-07 ENCOUNTER — Ambulatory Visit: Payer: BLUE CROSS/BLUE SHIELD | Admitting: Podiatry

## 2017-12-07 DIAGNOSIS — I70245 Atherosclerosis of native arteries of left leg with ulceration of other part of foot: Secondary | ICD-10-CM

## 2017-12-07 DIAGNOSIS — E0843 Diabetes mellitus due to underlying condition with diabetic autonomic (poly)neuropathy: Secondary | ICD-10-CM | POA: Diagnosis not present

## 2017-12-07 DIAGNOSIS — L97522 Non-pressure chronic ulcer of other part of left foot with fat layer exposed: Secondary | ICD-10-CM

## 2017-12-10 NOTE — Progress Notes (Signed)
   Subjective: 57 year old female with PMHx of DM presenting today for follow up evaluation of an ulceration of the left foot. She reports increased soreness of the wound. She has been applying gentamicin cream daily as well as using the post op shoe as directed without issue. There are no modifying factors noted. Patient is here for further evaluation and treatment.   Past Medical History:  Diagnosis Date  . Diabetes mellitus without complication (HCC)      Objective:  Physical Exam General: Alert and oriented x3 in no acute distress  Dermatology: Wound #1 noted to the left foot measuring approximately 1.0 x 1.0 x 0.1 cm.   To the above-noted ulceration, there is no eschar. There is a moderate amount of slough, fibrin and necrotic tissue. Granulation tissue and wound base is red. There is no malodor. There is a minimal amount of serosanginous drainage noted. Periwound integrity is intact.   Vascular: Palpable pedal pulses bilaterally. No edema or erythema noted. Capillary refill within normal limits.  Neurological: Epicritic and protective threshold grossly intact bilaterally.   Musculoskeletal Exam: Range of motion within normal limits bilateral. Muscle strength 5/5 in all groups bilateral.  Assessment: 1. Ulceration of the left foot secondary to diabetes mellitus  2. Diabetes mellitus with peripheral neuropathy    Plan of Care:  1. Patient evaluated 2. Medically necessary excisional debridement including subcutaneous tissue was performed using a tissue nipper and a chisel blade. Excisional debridement of all the necrotic nonviable tissue down to healthy bleeding viable tissue was performed with post-debridement measurements same as pre-. 3. The wound was cleansed and dry sterile dressing applied. 4. Patient has not follow up with endocrinologist or PCP.  5. Continue weightbearing in post op shoe.  6. Continue using gentamicin cream daily with a dry sterile dressing daily.  7.  Again, stressed importance of getting diabetes under control. Patient is at a high risk of limb loss.  8. Return to clinic in 4 weeks.      Felecia Shelling, DPM [facility]  Dr. Felecia Shelling, DPM    [address]                                        [place], [place] [address]                Office [phone]  Fax [phone]

## 2018-01-09 ENCOUNTER — Ambulatory Visit: Payer: BLUE CROSS/BLUE SHIELD | Admitting: Podiatry

## 2018-01-09 DIAGNOSIS — L97522 Non-pressure chronic ulcer of other part of left foot with fat layer exposed: Secondary | ICD-10-CM

## 2018-01-09 DIAGNOSIS — I70245 Atherosclerosis of native arteries of left leg with ulceration of other part of foot: Secondary | ICD-10-CM | POA: Diagnosis not present

## 2018-01-09 DIAGNOSIS — E0843 Diabetes mellitus due to underlying condition with diabetic autonomic (poly)neuropathy: Secondary | ICD-10-CM | POA: Diagnosis not present

## 2018-01-10 NOTE — Progress Notes (Signed)
   Subjective: 57 year old female with PMHx of DM presenting today for follow up evaluation of an ulceration of the left foot. She states the area looks better but she is still experiencing significant soreness. She has been using the post op shoe and applying Gentamicin cream for treatment. Patient is here for further evaluation and treatment.   Past Medical History:  Diagnosis Date  . Diabetes mellitus without complication (HCC)      Objective:  Physical Exam General: Alert and oriented x3 in no acute distress  Dermatology: Wound #1 noted to the left foot measuring approximately 0.1 x 0.1 x 0.1 cm.   To the above-noted ulceration, there is no eschar. There is a moderate amount of slough, fibrin and necrotic tissue. Granulation tissue and wound base is red. There is no malodor. There is a minimal amount of serosanginous drainage noted. Periwound integrity is intact.   Vascular: Palpable pedal pulses bilaterally. No edema or erythema noted. Capillary refill within normal limits.  Neurological: Epicritic and protective threshold grossly intact bilaterally.   Musculoskeletal Exam: Range of motion within normal limits bilateral. Muscle strength 5/5 in all groups bilateral.  Assessment: 1. Ulceration of the left foot secondary to diabetes mellitus  2. Diabetes mellitus with peripheral neuropathy    Plan of Care:  1. Patient evaluated 2. Medically necessary excisional debridement including subcutaneous tissue was performed using a tissue nipper and a chisel blade. Excisional debridement of all the necrotic nonviable tissue down to healthy bleeding viable tissue was performed with post-debridement measurements same as pre-. 3. The wound was cleansed and dry sterile dressing applied. 4. Continue using post op shoe.  5. Continue using Gentamicin cream daily with a bandage.  6. Return to clinic in 4 weeks.      Felecia Shelling, DPM [facility]  Dr. Felecia Shelling, DPM      [address]                                        [place], [place] [address]                Office [phone]  Fax [phone]

## 2018-02-06 ENCOUNTER — Ambulatory Visit: Payer: BLUE CROSS/BLUE SHIELD | Admitting: Podiatry

## 2018-02-06 DIAGNOSIS — L97522 Non-pressure chronic ulcer of other part of left foot with fat layer exposed: Secondary | ICD-10-CM

## 2018-02-06 DIAGNOSIS — E0843 Diabetes mellitus due to underlying condition with diabetic autonomic (poly)neuropathy: Secondary | ICD-10-CM

## 2018-02-06 DIAGNOSIS — I70245 Atherosclerosis of native arteries of left leg with ulceration of other part of foot: Secondary | ICD-10-CM

## 2018-02-08 NOTE — Progress Notes (Signed)
   Subjective: [age] year old female with PMHx of DM presenting today for follow up evaluation of an ulceration of the left foot. She states the wound has not changed much since her last visit. She denies any modifying factors. She has been using Gentamicin cream and post op shoe as directed. Patient is here for further evaluation and treatment.   Past Medical History:  Diagnosis Date  . Diabetes mellitus without complication (HCC)      Objective:  Physical Exam General: Alert and oriented x3 in no acute distress  Dermatology: Wound #1 noted to the left foot measuring approximately 0.5 x 0.5 x 0.1 cm.   To the above-noted ulceration, there is no eschar. There is a moderate amount of slough, fibrin and necrotic tissue. Granulation tissue and wound base is red. There is no malodor. There is a minimal amount of serosanginous drainage noted. Periwound integrity is intact.   Vascular: Palpable pedal pulses bilaterally. No edema or erythema noted. Capillary refill within normal limits.  Neurological: Epicritic and protective threshold grossly intact bilaterally.   Musculoskeletal Exam: Range of motion within normal limits bilateral. Muscle strength 5/5 in all groups bilateral.  Assessment: 1. Ulceration of the left foot secondary to diabetes mellitus  2. Diabetes mellitus with peripheral neuropathy    Plan of Care:  1. Patient evaluated 2. Medically necessary excisional debridement including subcutaneous tissue was performed using a tissue nipper and a chisel blade. Excisional debridement of all the necrotic nonviable tissue down to healthy bleeding viable tissue was performed with post-debridement measurements same as pre-. 3. The wound was cleansed and dry sterile dressing applied. 4. Continue using post op shoe.  5. Patient's insurance is changing at the end of the year and [facility] is being [facility]. Continue care there.  6. Return to clinic as needed.      Felecia ShellingBrent M. Taila Basinski,  DPM [facility]  Dr. Felecia ShellingBrent M. Jonothan Heberle, DPM    [address]                                        [place], [place] [address]                Office [phone]  Fax [phone]

## 2018-04-26 ENCOUNTER — Ambulatory Visit: Payer: BLUE CROSS/BLUE SHIELD | Admitting: Podiatry

## 2018-04-26 DIAGNOSIS — B351 Tinea unguium: Secondary | ICD-10-CM

## 2018-04-26 DIAGNOSIS — L97522 Non-pressure chronic ulcer of other part of left foot with fat layer exposed: Secondary | ICD-10-CM

## 2018-04-26 DIAGNOSIS — I70245 Atherosclerosis of native arteries of left leg with ulceration of other part of foot: Secondary | ICD-10-CM

## 2018-04-26 DIAGNOSIS — M79676 Pain in unspecified toe(s): Secondary | ICD-10-CM

## 2018-04-26 DIAGNOSIS — E0843 Diabetes mellitus due to underlying condition with diabetic autonomic (poly)neuropathy: Secondary | ICD-10-CM

## 2018-04-30 NOTE — Progress Notes (Signed)
   Subjective: 58 year old female with PMHx of DM presenting today for follow up evaluation of an ulceration of the left foot. She reports some soreness of the foot secondary to a procedure done by another podiatrist. She states the wound does not look as good as it has in the past. She denies any modifying factors. She has been using Gentamicin cream and post op shoe as directed.  She also complains of elongated, thickened nails 1-5 bilaterally that cause pain while ambulating in shoes. She is unable to trim her own nails. Patient is here for further evaluation and treatment.   Past Medical History:  Diagnosis Date  . Diabetes mellitus without complication (HCC)      Objective:  Physical Exam General: Alert and oriented x3 in no acute distress  Dermatology: Wound #1 noted to the left foot measuring approximately 1.5 x 1.5 x 0.3 cm.   To the above-noted ulceration, there is no eschar. There is a moderate amount of slough, fibrin and necrotic tissue. Granulation tissue and wound base is red. There is no malodor. There is a minimal amount of serosanginous drainage noted. Periwound integrity is intact.  Nails are tender, long, thickened and dystrophic with subungual debris, consistent with onychomycosis, 1-5 bilateral. No signs of infection noted.  Vascular: Palpable pedal pulses bilaterally. No edema or erythema noted. Capillary refill within normal limits.  Neurological: Epicritic and protective threshold diminished bilaterally.   Musculoskeletal Exam: Range of motion within normal limits bilateral. Muscle strength 5/5 in all groups bilateral.  Assessment: 1. Ulceration of the left foot secondary to diabetes mellitus  2. Diabetes mellitus with peripheral neuropathy  3. Onychomycosis of nail due to dermatophyte bilateral   Plan of Care:  1. Patient evaluated 2. Medically necessary excisional debridement including subcutaneous tissue was performed using a tissue nipper and a chisel  blade. Excisional debridement of all the necrotic nonviable tissue down to healthy bleeding viable tissue was performed with post-debridement measurements same as pre-. 3. The wound was cleansed and dry sterile dressing applied. 4. Continue dressing changes daily.  5. Mechanical debridement of nails 1-5 bilaterally performed using a nail nipper. Filed with dremel without incident.  6. Continue using post op shoe.  7. Patient's insurance is changing at the end of the year and she is being referred to [facility]. Patient was not satisfied with [facility].  8. Return to clinic as needed.      Felecia Shelling, DPM [facility]  Dr. Felecia Shelling, DPM    [address]                                        [place], [place] [address]                Office [phone]  Fax [phone]

## 2018-05-02 DIAGNOSIS — F418 Other specified anxiety disorders: Secondary | ICD-10-CM | POA: Insufficient documentation

## 2018-05-02 DIAGNOSIS — I70202 Unspecified atherosclerosis of native arteries of extremities, left leg: Secondary | ICD-10-CM | POA: Insufficient documentation

## 2018-05-02 DIAGNOSIS — F411 Generalized anxiety disorder: Secondary | ICD-10-CM | POA: Insufficient documentation

## 2018-05-03 ENCOUNTER — Other Ambulatory Visit: Payer: Self-pay

## 2018-05-03 MED ORDER — GABAPENTIN 100 MG PO CAPS: 100.0000 mg | ORAL_CAPSULE | Freq: Three times a day (TID) | ORAL | 3 refills | Status: DC

## 2018-05-03 NOTE — Progress Notes (Signed)
Refill Rx Gabapentin 100MG  90cap

## 2018-09-16 DIAGNOSIS — E559 Vitamin D deficiency, unspecified: Secondary | ICD-10-CM | POA: Insufficient documentation

## 2018-09-16 DIAGNOSIS — E039 Hypothyroidism, unspecified: Secondary | ICD-10-CM | POA: Insufficient documentation

## 2019-11-02 DIAGNOSIS — H2513 Age-related nuclear cataract, bilateral: Secondary | ICD-10-CM | POA: Insufficient documentation

## 2020-10-07 ENCOUNTER — Ambulatory Visit (INDEPENDENT_AMBULATORY_CARE_PROVIDER_SITE_OTHER): Payer: 59 | Admitting: Internal Medicine

## 2020-10-07 ENCOUNTER — Other Ambulatory Visit: Payer: Self-pay

## 2020-10-07 ENCOUNTER — Encounter: Payer: Self-pay | Admitting: Internal Medicine

## 2020-10-07 VITALS — BP 132/69 | HR 58 | Temp 97.8°F | Ht 66.0 in | Wt 269.7 lb

## 2020-10-07 DIAGNOSIS — Z13220 Encounter for screening for lipoid disorders: Secondary | ICD-10-CM

## 2020-10-07 DIAGNOSIS — R600 Localized edema: Secondary | ICD-10-CM

## 2020-10-07 DIAGNOSIS — Z1211 Encounter for screening for malignant neoplasm of colon: Secondary | ICD-10-CM

## 2020-10-07 DIAGNOSIS — I70202 Unspecified atherosclerosis of native arteries of extremities, left leg: Secondary | ICD-10-CM

## 2020-10-07 DIAGNOSIS — Z131 Encounter for screening for diabetes mellitus: Secondary | ICD-10-CM

## 2020-10-07 DIAGNOSIS — M545 Low back pain, unspecified: Secondary | ICD-10-CM | POA: Insufficient documentation

## 2020-10-07 DIAGNOSIS — F411 Generalized anxiety disorder: Secondary | ICD-10-CM

## 2020-10-07 DIAGNOSIS — Z0000 Encounter for general adult medical examination without abnormal findings: Secondary | ICD-10-CM

## 2020-10-07 DIAGNOSIS — E113319 Type 2 diabetes mellitus with moderate nonproliferative diabetic retinopathy with macular edema, unspecified eye: Secondary | ICD-10-CM | POA: Insufficient documentation

## 2020-10-07 DIAGNOSIS — Z1231 Encounter for screening mammogram for malignant neoplasm of breast: Secondary | ICD-10-CM

## 2020-10-07 DIAGNOSIS — F331 Major depressive disorder, recurrent, moderate: Secondary | ICD-10-CM

## 2020-10-07 DIAGNOSIS — E039 Hypothyroidism, unspecified: Secondary | ICD-10-CM

## 2020-10-07 DIAGNOSIS — E559 Vitamin D deficiency, unspecified: Secondary | ICD-10-CM | POA: Diagnosis not present

## 2020-10-07 DIAGNOSIS — Z794 Long term (current) use of insulin: Secondary | ICD-10-CM

## 2020-10-07 DIAGNOSIS — R413 Other amnesia: Secondary | ICD-10-CM

## 2020-10-07 DIAGNOSIS — E113313 Type 2 diabetes mellitus with moderate nonproliferative diabetic retinopathy with macular edema, bilateral: Secondary | ICD-10-CM | POA: Diagnosis not present

## 2020-10-07 DIAGNOSIS — G8929 Other chronic pain: Secondary | ICD-10-CM

## 2020-10-07 DIAGNOSIS — F329 Major depressive disorder, single episode, unspecified: Secondary | ICD-10-CM

## 2020-10-07 DIAGNOSIS — Z114 Encounter for screening for human immunodeficiency virus [HIV]: Secondary | ICD-10-CM

## 2020-10-07 DIAGNOSIS — Z1159 Encounter for screening for other viral diseases: Secondary | ICD-10-CM

## 2020-10-07 DIAGNOSIS — E1142 Type 2 diabetes mellitus with diabetic polyneuropathy: Secondary | ICD-10-CM

## 2020-10-07 LAB — GLUCOSE, CAPILLARY: Glucose-Capillary: 296 mg/dL — ABNORMAL HIGH (ref 70–99)

## 2020-10-07 LAB — POCT GLYCOSYLATED HEMOGLOBIN (HGB A1C): Hemoglobin A1C: 12.2 % — AB (ref 4.0–5.6)

## 2020-10-07 MED ORDER — OZEMPIC (0.25 OR 0.5 MG/DOSE) 2 MG/1.5ML ~~LOC~~ SOPN
0.2500 mg | PEN_INJECTOR | SUBCUTANEOUS | 0 refills | Status: AC
Start: 2020-10-07 — End: 2020-11-06

## 2020-10-07 MED ORDER — DULOXETINE HCL 30 MG PO CPEP: ORAL_CAPSULE | ORAL | 0 refills | Status: DC

## 2020-10-07 MED ORDER — NOVOLOG FLEXPEN 100 UNIT/ML ~~LOC~~ SOPN: 35.0000 [IU] | PEN_INJECTOR | Freq: Three times a day (TID) | SUBCUTANEOUS | 1 refills | Status: DC

## 2020-10-07 MED ORDER — GABAPENTIN 300 MG PO CAPS: ORAL_CAPSULE | ORAL | 2 refills | Status: DC

## 2020-10-07 MED ORDER — EMPAGLIFLOZIN 10 MG PO TABS: 10.0000 mg | ORAL_TABLET | Freq: Every day | ORAL | 2 refills | Status: DC

## 2020-10-07 MED ORDER — TOUJEO SOLOSTAR 300 UNIT/ML ~~LOC~~ SOPN: 42.0000 [IU] | PEN_INJECTOR | Freq: Every day | SUBCUTANEOUS | 1 refills | Status: DC

## 2020-10-07 NOTE — Assessment & Plan Note (Signed)
Patient reports she has difficulty with short-term memory that she has noticed recently.  States this is been bothersome over the past several months.  Has a family history of Alzheimer's dementia.  She mentioned this towards the end of our encounter so we will obtain a [place] at next visit.  We will check a TSH and vitamin B12 today.

## 2020-10-07 NOTE — Assessment & Plan Note (Addendum)
Hemoglobin A1c 12.2 today, similar to 12.3 from 3 years ago.  Patient has been out of her home insulin of NovoLog and Toujeo.  States that she is been sparingly using the Toujeo she was on her last box of this medication.  She states that her diabetes was most controlled when she was also on Jardiance.  Her lowest hemoglobin A1c that she remembers was 8.5.  She feels very depressed and discouraged due to her uncontrolled diabetes.  She also endorses severe bilateral lower extremity neuropathy.  Discussed importance of restarting her insulin as well as other therapies.  Patient states her insurance recently changed and in the middle of the switch she was unable to get established and get refills.  States that many of her medications have been costly in the past and she has been unable to afford them.  Discussed that with her new insurance this will hopefully not be a problem.  Patient states that she has difficulty ambulating due to chronic lower back pain.  States that she has had disc disease since she was young.  She also states her lower extremity neuropathy has been poorly controlled on her current gabapentin dose.  She has been taking 1 to 200 mg of gabapentin during the day and up to 400 mg at night with minimal relief.  Discussed uptitrating this medication, if patient endorses significant drowsiness on these increased doses she is to call us so we can adjust this.  Assessment and plan: -Encourage lifestyle modifications with diet and exercise -Restart Toujeo 42 units daily and NovoLog 35 units 3 times daily with meals -Start Jardiance 10 mg daily  -Start Ozempic 0.25 mg weekly increase dose every 4-week -Increase gabapentin to 300 mg 2 times daily and up to 600 mg at bedtime -Patient is to inform us if any of her medications are still costly with her current insurance, may need to refer to [facility] our clinical pharmacist for further assistance -Follow-up hemoglobin A1c in 3 months -Referral to  [facility] for assistance with managing patient's uncontrolled diabetes, appreciate assistance -Patient will need a referral to ophthalmology in the future, last seen 1 year ago but now has a different insurance and needs new referral for in network ophthalmologist.  Patient would like to defer this referral at this time

## 2020-10-07 NOTE — Assessment & Plan Note (Addendum)
Patient reports a longstanding history of bilateral lower extremity swelling and skin discoloration.  She states that her right foot appears darker than her left.  Due to this she avoids wearing sandals.  She was prescribed hydrochlorothiazide 12.5 mg and takes this as needed for swelling.  She tries to keep her legs elevated at home.  She also states that she has compression stockings however does not like wearing these as they make the indentations from her swelling worse.  Patient denies any signs or symptoms of heart failure such as dyspnea on exertion or at rest, orthopnea, or cough.  Does have difficulty walking long distances but attributes this to her chronic lower back pain.  On exam there is 2+ pitting edema bilateral lower extremities up to the mid shins as well as skin changes consistent with chronic venous insufficiency.  Extremities are warm to touch and pulses intact.      Assessment/plan: Differential diagnosis includes chronic venous insufficiency versus heart failure, although low suspicion with minimal symptoms of heart failure.  ABIs from 2018 unremarkable.  Recommended patient to continue use of compression socks as tolerated and continue elevating legs at home.  If symptoms persist or worsen can consider an echocardiogram for evaluation of heart failure

## 2020-10-07 NOTE — Assessment & Plan Note (Signed)
Vitamin D level 15 2 years ago.  Patient has been taking vitamin D supplements 2000 IU nearly daily.  Recheck vitamin D level today.

## 2020-10-07 NOTE — Assessment & Plan Note (Addendum)
Patient reports longstanding history of chronic lower back pain which she attributes to disc disease diagnosed at a very young age.  States that her chronic lower back pain limits her activity.  She currently takes over-the-counter medications like Tylenol and ibuprofen for her pain as needed.  Per chart review, lumbar radiograph from 2009 showed mild disc space loss with intervertebral spurring at L1-L2  and L2-L3 and mild facet degenerative changes are present inferiorly.  No red flag symptoms such as bowel or bladder incontinence, saddle paresthesias, bilateral lower extremity weakness or weight loss.  Assessment/plan: - Encouraged strengthening exercises - Start duloxetine for chronic back pain as well as anxiety and depression -Consider referral to physical therapy if symptoms persist and or worsen

## 2020-10-07 NOTE — Progress Notes (Signed)
   CC: establish care, diabetes, anxiety, depression  HPI:  [name] is a 60 y.o. 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 For details of today's visit and the status of his chronic medical issues please refer to the assessment and plan.   Past Medical History:  Diagnosis Date   Diabetes mellitus without complication (HCC)    PMHx: DM, hypothyroidism, nonproliferative retinopathy, macular edema  Surgical history: Cesarean section, tubal ligation, foot surgery  Allergies: Shellfish derived products, sulfamethizole-trimethoprim  Family history: Mother and maternal grandmother had Alzheimer's dementia.  Mother also had diabetes and lung cancer  Social history: Patient is retired, worked in [place] for 23 years where she met her husband.  Currently lives at home in [place] with her husband.  Has 2 adult female children, 1 unfortunately recently diagnosed with liver cancer.  She smoked about a pack of cigarettes a day for 30 years, quit in 2003.  States she often thinks about resuming smoking due to her anxiety and depression.  Drinks alcohol most days of the week, couple ounces of rum per day mixed with other drinks.  Denies any history of alcohol withdrawal.  Denies any illicit drug use.  Review of Systems:   Negative except as per assessment and plan.  Physical Exam:  Vitals:   10/07/20 0842  BP: 132/69  Pulse: (!) 58  Temp: 97.8 F (36.6 C)  TempSrc: Oral  SpO2: 97%  Weight: 269 lb 11.2 oz (122.3 kg)  Height: _0  (1.676 m)   Physical Exam General: alert, appears stated age, in no acute distress HEENT: Normocephalic, atraumatic, EOM intact, conjunctiva normal CV: Regular rate and rhythm, no murmurs rubs or gallops Pulm: Clear to auscultation bilaterally, normal work of breathing Abdomen: Soft, nondistended, bowel sounds present, no tenderness  to palpation MSK: Bilateral lower extremity 2+ pitting edema to mid shins Skin: Warm and dry, chronic skin hyperpigmentation of bilateral lower extremities Neuro: Alert and oriented x3      Assessment & Plan:   See Encounters Tab for problem based charting.  Patient discussed with Dr. Philipp Ovens

## 2020-10-07 NOTE — Assessment & Plan Note (Signed)
GAD-7 score 13.  Patient endorses generalized anxiety for the past several years.  States that she is taking escitalopram for her anxiety and depression with minimal relief.  States this initially helped but does not feel it is helping anymore.  Has a significant amount of anxiety due to her son's recent diagnosis of liver cancer.  Assessment/plan: Discussed starting duloxetine.  Also recommended counseling however patient would like to defer at this time.

## 2020-10-07 NOTE — Assessment & Plan Note (Signed)
Patient has a reported history of hypothyroidism.  Per chart review she has had numerous elevated TSH levels and an elevated thyroid peroxidase antibody greater than 900 and normal thyroglobulin antibody 1 year ago.  Total T4 and free T3 were within normal limits at that time.  Patient endorses symptoms of fatigue, sleepiness and depression.  She was previously on levothyroxine 100 mcg daily.  States that she stopped taking this medication when she ran out several months ago.  Discussed rechecking thyroid function today.  Plan: Follow-up TSH, total T3 and free T4

## 2020-10-07 NOTE — Assessment & Plan Note (Signed)
Patient has a history of declining mammograms.  States that she is not interested in this this time.  Denies any history of breast cancer in the family.  Will discuss referral for colonoscopy and Pap smear at the next visit.

## 2020-10-07 NOTE — Assessment & Plan Note (Addendum)
Per chart review there is history of atherosclerotic disease of the left leg.  However ABIs from 2018 are within normal limits.No evidence of hemodynamically significant lower extremity arterial occlusive disease at rest.

## 2020-10-07 NOTE — Patient Instructions (Addendum)
Ms Reining,   It was a pleasure meeting you today. Today we discussed the following:   Diabetes- Your hemoglobin A1c is 12.2. Don't get discouraged, we will work on this with restarting your insulin and your jardiance. We will recheck your A1c in 3 months.  For your anxiety, depression and chronic back pain I want you to start taking Cymbalta 30 mg once daily for 7 days.  Increase this to 60 mg after 1 week. I am checking several labs today, I will call you with these results      Please call the internal medicine center clinic if you have any questions or concerns, we may be able to help and keep you from a long and expensive emergency room wait. Our clinic and after hours phone number is [phone], the best time to call is Monday through Friday 9 am to 4 pm but there is always someone available 24/7 if you have an emergency. If you need medication refills please notify your pharmacy one week in advance and they will send [place] a request.   Thank you for allowing [place] to be a part of your care!

## 2020-10-07 NOTE — Assessment & Plan Note (Addendum)
>>  ASSESSMENT AND PLAN FOR DEPRESSION WITH ANXIETY WRITTEN ON 10/07/2020  1:14 PM BY Caid Radin N, DO  PHQ-9 is 17 today.  Patient endorses depression.  She has been taking Escitalopram 20 mg for about 2 years.  States that she initially was taking 10 mg but this was uptitrated.  She states that this helped initially but she does not feel like it is helping her depression lately.  She feels fatigue, excessive sleepiness and just overall low mood.  She also endorses significant generalized anxiety.  She feels discouraged and depressed about her medical conditions as well as her son who was recently diagnosed with liver cancer.  She denies any suicidal ideations or thoughts at this time.  Assessment/plan: Given patient's longstanding history of major depressive disorder and generalized anxiety along with chronic back pain discussed benefits of trialing duloxetine.  Patient is agreeable to this plan.  Also discussed concomitant counseling.  Patient would like to defer at this time.  -Start duloxetine 30 mg for 7 days then increase to 60 mg daily -Follow-up in 4 weeks  >>ASSESSMENT AND PLAN FOR GAD (GENERALIZED ANXIETY DISORDER) WRITTEN ON 10/07/2020  1:20 PM BY Artavious Trebilcock N, DO  GAD-7 score 13.  Patient endorses generalized anxiety for the past several years.  States that she is taking escitalopram for her anxiety and depression with minimal relief.  States this initially helped but does not feel it is helping anymore.  Has a significant amount of anxiety due to her son's recent diagnosis of liver cancer.  Assessment/plan: Discussed starting duloxetine.  Also recommended counseling however patient would like to defer at this time.

## 2020-10-08 LAB — MICROALBUMIN / CREATININE URINE RATIO
Creatinine, Urine: 112.6 mg/dL
Microalb/Creat Ratio: 18 mg/g creat (ref 0–29)
Microalbumin, Urine: 20.1 ug/mL

## 2020-10-08 LAB — CMP14 + ANION GAP
ALT: 60 IU/L — ABNORMAL HIGH (ref 0–32)
AST: 35 IU/L (ref 0–40)
Albumin/Globulin Ratio: 2 (ref 1.2–2.2)
Albumin: 4 g/dL (ref 3.8–4.8)
Alkaline Phosphatase: 93 IU/L (ref 44–121)
Anion Gap: 16 mmol/L (ref 10.0–18.0)
BUN/Creatinine Ratio: 13 (ref 12–28)
BUN: 10 mg/dL (ref 8–27)
Bilirubin Total: 0.4 mg/dL (ref 0.0–1.2)
CO2: 24 mmol/L (ref 20–29)
Calcium: 9.3 mg/dL (ref 8.7–10.3)
Chloride: 100 mmol/L (ref 96–106)
Creatinine, Ser: 0.78 mg/dL (ref 0.57–1.00)
Globulin, Total: 2 g/dL (ref 1.5–4.5)
Glucose: 311 mg/dL — ABNORMAL HIGH (ref 65–99)
Potassium: 4.6 mmol/L (ref 3.5–5.2)
Sodium: 140 mmol/L (ref 134–144)
Total Protein: 6 g/dL (ref 6.0–8.5)
eGFR: 86 mL/min/{1.73_m2} (ref >59)

## 2020-10-08 LAB — LIPID PANEL
Chol/HDL Ratio: 3.8 ratio (ref 0.0–4.4)
Cholesterol, Total: 169 mg/dL (ref 100–199)
HDL: 45 mg/dL (ref >39)
LDL Chol Calc (NIH): 105 mg/dL — ABNORMAL HIGH (ref 0–99)
Triglycerides: 104 mg/dL (ref 0–149)
VLDL Cholesterol Cal: 19 mg/dL (ref 5–40)

## 2020-10-08 LAB — HEPATITIS C ANTIBODY: Hep C Virus Ab: <0.1 s/co ratio (ref 0.0–0.9)

## 2020-10-08 LAB — TSH: TSH: 15.5 u[IU]/mL — ABNORMAL HIGH (ref 0.450–4.500)

## 2020-10-08 LAB — T4, FREE: Free T4: 1.14 ng/dL (ref 0.82–1.77)

## 2020-10-08 LAB — T3: T3, Total: 143 ng/dL (ref 71–180)

## 2020-10-08 LAB — VITAMIN B12: Vitamin B-12: 588 pg/mL (ref 232–1245)

## 2020-10-08 LAB — VITAMIN D 25 HYDROXY (VIT D DEFICIENCY, FRACTURES): Vit D, 25-Hydroxy: 25 ng/mL — ABNORMAL LOW (ref 30.0–100.0)

## 2020-10-08 LAB — HIV ANTIBODY (ROUTINE TESTING W REFLEX): HIV Screen 4th Generation wRfx: NONREACTIVE

## 2020-10-10 ENCOUNTER — Other Ambulatory Visit: Payer: Self-pay | Admitting: Internal Medicine

## 2020-10-10 DIAGNOSIS — E113313 Type 2 diabetes mellitus with moderate nonproliferative diabetic retinopathy with macular edema, bilateral: Secondary | ICD-10-CM

## 2020-10-10 DIAGNOSIS — E039 Hypothyroidism, unspecified: Secondary | ICD-10-CM

## 2020-10-10 MED ORDER — LEVOTHYROXINE SODIUM 150 MCG PO TABS: 150.0000 ug | ORAL_TABLET | Freq: Every day | ORAL | 2 refills | Status: DC

## 2020-10-10 NOTE — Progress Notes (Signed)
Discussed bloodwork with patient. Restarting thyroid medication. States her jardiance cost $200 for a 30 day supply and ozempic prescription was not at her pharmacy.  Spoke with her pharmacy and they state that they were out of Ozempic, will get Ozempic on Monday.  We will also send in a referral to our clinical pharmacist to help with the cost of these medications.

## 2020-10-10 NOTE — Progress Notes (Signed)
Internal Medicine Clinic Attending ° °Case discussed with Dr. Rehman  At the time of the visit.  We reviewed the resident’s history and exam and pertinent patient test results.  I agree with the assessment, diagnosis, and plan of care documented in the resident’s note.  ° °

## 2020-10-13 ENCOUNTER — Other Ambulatory Visit: Payer: Self-pay | Admitting: *Deleted

## 2020-10-13 DIAGNOSIS — F331 Major depressive disorder, recurrent, moderate: Secondary | ICD-10-CM

## 2020-10-13 DIAGNOSIS — F411 Generalized anxiety disorder: Secondary | ICD-10-CM

## 2020-10-13 NOTE — Telephone Encounter (Signed)
Request for duloxetine 30 mg  Refill too early  Last rx 10/07/20 #67 Pt has f/u appt on 8/30-will address refill at that time

## 2020-10-27 ENCOUNTER — Ambulatory Visit (INDEPENDENT_AMBULATORY_CARE_PROVIDER_SITE_OTHER): Payer: 59 | Admitting: Pharmacist

## 2020-10-27 DIAGNOSIS — E113313 Type 2 diabetes mellitus with moderate nonproliferative diabetic retinopathy with macular edema, bilateral: Secondary | ICD-10-CM | POA: Diagnosis not present

## 2020-10-27 DIAGNOSIS — Z794 Long term (current) use of insulin: Secondary | ICD-10-CM

## 2020-10-27 NOTE — Assessment & Plan Note (Signed)
T2DM is not controlled but improving likely due to patient previously being out of medication but having obtained everything since last office visit. Medication adherence appears optimal. Patient unable to continue to afford all medications. Will have patient continue to take Ozempic 0.25mg  once weekly and avoid titrating to prolong use of medication to 8 weeks, and then will subsequently have to discontinue due to cost. Provided patient with co-pay card for Novolog and started process for patient assistance for Jardiance. Due to patient's frequent afternoon hypoglycemic events decreased Novolog to 30 units TID. Following instruction patient verbalized understanding of treatment plan.    1. Continued basal insulin Toujeo 42 units once daily  2. Decreased dose of  rapid insulin Novolog to 30 units TID with meals  3. Started GLP-1 Ozempic 0.25mg  once weekly  4. Continued SGLT2-I Jardiance 10mg  once daily 5. Extensively discussed pathophysiology of diabetes, dietary effects on blood sugar control, and recommended lifestyle interventions. 6. Counseled on s/sx of and management of hypoglycemia 7. Next A1C anticipated November 2022.   Follow-up appointment four weeks to review sugar readings. Written patient instructions provided.

## 2020-10-27 NOTE — Progress Notes (Signed)
   Subjective:    Patient ID: Kathy Ramirez, female    DOB: 03-19-1960, 60 y.o.   MRN: [id number]  HPI Patient is a 60 y.o. female who presents for diabetes management. She is in good spirits and presents with assistance of wheelchair and her husband. Patient was referred and last seen by Primary Care Provider on 10/07/20.  Insurance coverage/medication affordability: [employer]  Family/Social history: lives with husband  Current diabetes medications include: insulin aspart (Novolog) 36-42 units TID, insulin glargine (Toujeo) 42 units daily in evening, empagliflozin (Jardiance) 10mg  once daily, planning to start Ozempic 0.25mg  once weekly today Current hypertension medications include: N/a Current hyperlipidemia medications include: N/a  Patient states that She is taking her medications as prescribed. Patient reports adherence with medications.   Do you feel that your medications are working for you?  yes  Have you been experiencing any side effects to the medications prescribed? no  Do you have any problems obtaining medications due to transportation or finances?  Yes; Cost of Jardiance was $200, Ozempic was $400, and Novolog was $100   Patient reports hypoglycemic events between 50-70's mostly in afternoon. Patient denies polyuria (increased urination).  Patient denies polyphagia (increased appetite).  Patient denies polydipsia (increased thirst).  Patient reports neuropathy (nerve pain). Patient reports visual changes. Patient reports self foot exams.   Patient forgot meter at home but reports below readings: Home fasting blood sugars: low 100's  2 hour post-meal/random blood sugars: high 100-low 200's  Objective:   Labs:   Physical Exam Neurological:     Mental Status: She is alert and oriented to person, place, and time.    Lab Results  Component Value Date   HGBA1C 12.2 (A) 10/07/2020   HGBA1C 12.3 (H) 04/07/2017    Lab Results  Component Value Date    MICRALBCREAT 18 10/07/2020    Lipid Panel     Component Value Date/Time   CHOL 169 10/07/2020 1036   TRIG 104 10/07/2020 1036   HDL 45 10/07/2020 1036   CHOLHDL 3.8 10/07/2020 1036   LDLCALC 105 (H) 10/07/2020 1036    Assessment/Plan:   T2DM is not controlled but improving likely due to patient previously being out of medication but having obtained everything since last office visit. Medication adherence appears optimal. Patient unable to continue to afford all medications. Will have patient continue to take Ozempic 0.25mg  once weekly and avoid titrating to prolong use of medication to 8 weeks, and then will subsequently have to discontinue due to cost. Provided patient with co-pay card for Novolog and started process for patient assistance for Jardiance. Due to patient's frequent afternoon hypoglycemic events decreased Novolog to 30 units TID. Following instruction patient verbalized understanding of treatment plan.    Continued basal insulin Toujeo 42 units once daily  Decreased dose of  rapid insulin Novolog to 30 units TID with meals  Started GLP-1 Ozempic 0.25mg  once weekly  Continued SGLT2-I Jardiance 10mg  once daily Extensively discussed pathophysiology of diabetes, dietary effects on blood sugar control, and recommended lifestyle interventions. Counseled on s/sx of and management of hypoglycemia Next A1C anticipated November 2022.   Follow-up appointment four weeks to review sugar readings. Written patient instructions provided.  This appointment required 60 minutes of direct patient care.  Thank you for involving pharmacy to assist in providing this patient's care.

## 2020-10-27 NOTE — Patient Instructions (Addendum)
Kathy Ramirez it was a pleasure seeing you today.   Please do the following:  Decrease Novolog to 30 units three units with meals as directed today during your appointment. If you have any questions or if you believe something has occurred because of this change, call me or your doctor to let one of [place] know.  Continue checking blood sugars at home. It's really important that you record these and bring these in to your next doctor's appointment.  Continue making the lifestyle changes we've discussed together during our visit. Diet and exercise play a significant role in improving your blood sugars.  Follow-up with me in four weeks.  Please go to [facility] and ask them to print out the test claim for the cost of the Jardiance and also your yearly out of pocket prescription expenses   Hypoglycemia or low blood sugar:   Low blood sugar can happen quickly and may become an emergency if not treated right away.   While this shouldn't happen often, it can be brought upon if you skip a meal or do not eat enough. Also, if your insulin or other diabetes medications are dosed too high, this can cause your blood sugar to go to low.   Warning signs of low blood sugar include: Feeling shaky or dizzy Feeling weak or tired  Excessive hunger Feeling anxious or upset  Sweating even when you aren't exercising  What to do if I experience low blood sugar? Follow the Rule of 15 Check your blood sugar with your meter. If lower than 70, proceed to step 2.  Treat with 15 grams of fast acting carbs which is found in 3-4 glucose tablets. If none are available you can try hard candy, 1 tablespoon of sugar or honey,4 ounces of fruit juice, or 6 ounces of REGULAR soda.  Re-check your sugar in 15 minutes. If it is still below 70, do what you did in step 2 again. If your blood sugar has come back up, go ahead and eat a snack or small meal made up of complex carbs (ex. Whole grains) and protein at this time to avoid recurrence  of low blood sugar.

## 2020-10-28 ENCOUNTER — Encounter: Payer: Self-pay | Admitting: Internal Medicine

## 2020-10-28 ENCOUNTER — Ambulatory Visit (INDEPENDENT_AMBULATORY_CARE_PROVIDER_SITE_OTHER): Payer: 59 | Admitting: Internal Medicine

## 2020-10-28 ENCOUNTER — Other Ambulatory Visit: Payer: Self-pay

## 2020-10-28 VITALS — BP 114/66 | HR 77 | Resp 16 | Wt 269.2 lb

## 2020-10-28 DIAGNOSIS — R413 Other amnesia: Secondary | ICD-10-CM

## 2020-10-28 DIAGNOSIS — E559 Vitamin D deficiency, unspecified: Secondary | ICD-10-CM

## 2020-10-28 DIAGNOSIS — F329 Major depressive disorder, single episode, unspecified: Secondary | ICD-10-CM

## 2020-10-28 DIAGNOSIS — F411 Generalized anxiety disorder: Secondary | ICD-10-CM | POA: Diagnosis not present

## 2020-10-28 DIAGNOSIS — E039 Hypothyroidism, unspecified: Secondary | ICD-10-CM

## 2020-10-28 DIAGNOSIS — F331 Major depressive disorder, recurrent, moderate: Secondary | ICD-10-CM

## 2020-10-28 MED ORDER — DULOXETINE HCL 60 MG PO CPEP: 60.0000 mg | ORAL_CAPSULE | Freq: Every day | ORAL | 1 refills | Status: DC

## 2020-10-28 NOTE — Patient Instructions (Addendum)
It was great seeing you again today!  Please follow-up in about 4 weeks to recheck your thyroid function.  Please go ahead and stop taking biotin now.  For your memory loss, we will complete an assessment at your next visit.

## 2020-10-28 NOTE — Assessment & Plan Note (Signed)
Patient reports a history of short-term memory loss for the past 1 to 2 years.  She states she is mostly recognized this with her daily tasks.  She states that she will go into her kitchen and often times forget why she went there.  She also states that she will leave the house with an organized plan of errands but will forget what her plans were once she is out.  She denies any difficulty finding words or with language.  States that she does have intermittent issues with her gait feeling unstable at times.  She is interested in medications or Prevagen therapy.  She does state that she has 2 family members with dementia she does not recall if this was Alzheimer's or something else.  Plan: Recent TSH was consistent with hypothyroidism which could possibly be contributing.  Of note however patient was on biotin at the time of this blood work and we will repeat in 4 weeks after patient has discontinued biotin.  Vitamin B12 level was normal.  Patient unable to stay to complete a Mo[facility] assessment today, will complete at next visit

## 2020-10-28 NOTE — Assessment & Plan Note (Signed)
Repeat vitamin D level last visit was 25.  Recommended patient continue her daily vitamin D supplements.  Can recheck vitamin D in a few months.

## 2020-10-28 NOTE — Progress Notes (Signed)
Internal Medicine Clinic Attending ° °Case discussed with Dr. Rehman  At the time of the visit.  We reviewed the resident’s history and exam and pertinent patient test results.  I agree with the assessment, diagnosis, and plan of care documented in the resident’s note.  ° °

## 2020-10-28 NOTE — Assessment & Plan Note (Signed)
Patient is currently taking Synthroid 150 mcg daily before breakfast, she is tolerating this well without any side effects.  TSH checked at last visit was 15.5, normal T4 and T3.  Patient today reports that she has been taking biotin for several years.  Discussed that this can affect the results of her thyroid studies.  Recommended that she discontinue biotin at this time and we will repeat studies at our next visit in about 4 weeks.  Plan: -Discontinue biotin and repeat thyroid studies in 4 weeks -Continue levothyroxine 150 mcg daily

## 2020-10-28 NOTE — Assessment & Plan Note (Signed)
PHQ-9 is 10 today.  Patient is currently on duloxetine 60 mg daily.  States that she is tolerating this medication well.  Is not experiencing any side effects.  States that she overall feels better.  Discussed that maximal effect is usually not seen at about the 4 to 6-week mark.  Will follow-up in about 4 weeks.  Plan: Continue duloxetine 60 mg daily

## 2020-10-28 NOTE — Progress Notes (Signed)
   CC: Depression, hypothyroidism, memory change  HPI:  [name] is a 60 y.o. with a past medical history listed below presenting for follow-up of her depression, hypothyroidism and memory changes. For details of today's visit and the status of his chronic medical issues please refer to the assessment and plan.   Past Medical History:  Diagnosis Date   Depression    Diabetes mellitus without complication (HCC)    Thyroid disease    Review of Systems: Negative except as per assessment and plan.  Physical Exam:  Vitals:   10/28/20 0946  BP: 114/66  Pulse: 77  Resp: 16  SpO2: 97%  Weight: 269 lb 3.2 oz (122.1 kg)   Physical Exam General: alert, appears stated age, in no acute distress HEENT: Normocephalic, atraumatic, EOM intact, conjunctiva normal CV: Regular rate and rhythm, no murmurs rubs or gallops Pulm: Clear to auscultation bilaterally, normal work of breathing Abdomen: Soft, nondistended, bowel sounds present, no tenderness to palpation MSK: No lower extremity edema Skin: Warm and dry Neuro: Alert and oriented x3   Assessment & Plan:   See Encounters Tab for problem based charting.  Patient discussed with Dr. Mayford Knife

## 2020-10-30 ENCOUNTER — Encounter: Payer: Self-pay | Admitting: Internal Medicine

## 2020-11-21 ENCOUNTER — Ambulatory Visit (HOSPITAL_BASED_OUTPATIENT_CLINIC_OR_DEPARTMENT_OTHER): Payer: BLUE CROSS/BLUE SHIELD | Admitting: Nurse Practitioner

## 2020-11-24 ENCOUNTER — Encounter: Payer: 59 | Admitting: Student

## 2020-11-24 ENCOUNTER — Encounter: Payer: 59 | Admitting: Dietician

## 2020-11-24 ENCOUNTER — Encounter: Payer: 59 | Admitting: Pharmacist

## 2020-11-27 NOTE — Telephone Encounter (Signed)
Error

## 2020-12-05 ENCOUNTER — Telehealth: Payer: Self-pay

## 2020-12-05 ENCOUNTER — Other Ambulatory Visit: Payer: Self-pay

## 2020-12-05 DIAGNOSIS — E113313 Type 2 diabetes mellitus with moderate nonproliferative diabetic retinopathy with macular edema, bilateral: Secondary | ICD-10-CM

## 2020-12-05 MED ORDER — TOUJEO SOLOSTAR 300 UNIT/ML ~~LOC~~ SOPN: 42.0000 [IU] | PEN_INJECTOR | Freq: Every day | SUBCUTANEOUS | 1 refills | Status: DC

## 2020-12-05 MED ORDER — NOVOLOG FLEXPEN 100 UNIT/ML ~~LOC~~ SOPN: 35.0000 [IU] | PEN_INJECTOR | Freq: Three times a day (TID) | SUBCUTANEOUS | 1 refills | Status: DC

## 2020-12-05 NOTE — Telephone Encounter (Signed)
Ozempic expired and is no longer on current med list. Per Dr. Michel Harrow OV note on 8/29, patient was to continue this.

## 2020-12-05 NOTE — Telephone Encounter (Signed)
Pt is requesting a call back she called to request medication refill  which I did but in the medication she was requesting ( ozempic )  but per her med list it said Ozempic has ended  since last month

## 2020-12-10 ENCOUNTER — Ambulatory Visit (INDEPENDENT_AMBULATORY_CARE_PROVIDER_SITE_OTHER): Payer: 59 | Admitting: Internal Medicine

## 2020-12-10 ENCOUNTER — Encounter: Payer: Self-pay | Admitting: Internal Medicine

## 2020-12-10 VITALS — BP 111/81 | HR 91 | Temp 98.7°F | Wt 271.1 lb

## 2020-12-10 DIAGNOSIS — Z794 Long term (current) use of insulin: Secondary | ICD-10-CM

## 2020-12-10 DIAGNOSIS — Z0000 Encounter for general adult medical examination without abnormal findings: Secondary | ICD-10-CM | POA: Diagnosis not present

## 2020-12-10 DIAGNOSIS — E039 Hypothyroidism, unspecified: Secondary | ICD-10-CM | POA: Diagnosis not present

## 2020-12-10 DIAGNOSIS — E113313 Type 2 diabetes mellitus with moderate nonproliferative diabetic retinopathy with macular edema, bilateral: Secondary | ICD-10-CM | POA: Diagnosis not present

## 2020-12-10 MED ORDER — EMPAGLIFLOZIN 10 MG PO TABS: 10.0000 mg | ORAL_TABLET | Freq: Every day | ORAL | 2 refills | Status: DC

## 2020-12-10 MED ORDER — TIRZEPATIDE 2.5 MG/0.5ML ~~LOC~~ SOAJ: 2.5000 mg | SUBCUTANEOUS | 0 refills | Status: AC

## 2020-12-10 NOTE — Telephone Encounter (Signed)
Patient prescribed Mounjaro and given $25 coupon from product website. She was hesitant as she really enjoyed ozempic but is agreeable given cheaper price.

## 2020-12-10 NOTE — Patient Instructions (Addendum)
[name],  It was a pleasure seeing you in clinic. Today we discussed:   Diabetes:  Prescription for [facility] sent to the pharmacy. Dr Nicholaus Bloom will help you with uptitrating this medication and the cost. At this time, I recommend a continuous glucose monitor to keep check your blood sugars closely so we are able to titrate your insulin appropriately.   Thyroid studies:  Continue to take your levothyroxine every morning on an empty stomach with water, at least 30 minutes before breakfast. We will check your thyroid studies at the next visit.   If you have any questions or concerns, please call our clinic at [phone] between 9am-5pm and after hours call [phone] and ask for the internal medicine resident on call. If you feel you are having a medical emergency please call 911.   Thank you, we look forward to helping you remain healthy!

## 2020-12-11 NOTE — Assessment & Plan Note (Signed)
Patient is on levothyroxine daily prior to breakfast and tolerating well. She believes this dose is too high for her though. Prior TSH 15.5 with normal T3/T4. However, this was while she was taking biotin. Patient advised to discontinue biotin at prior visit and to follow up for repeat thyroid studies. However, patient reports that she  Continues to take biotin 2-3 times weekly. Discussed that this would interfere with the thyroid study results.  Plan: Recommended to discontinue biotin and repeat thyroid studies in 1 week Continue Synthroid daily

## 2020-12-11 NOTE — Progress Notes (Signed)
   CC: diabetes follow up  HPI:  [name] is a 60 y.o. female with PMHx as stated below presenting for follow up of her diabetes. No acute concerns at this time. Please see problem based charting for complete assessment and plan.   Past Medical History:  Diagnosis Date   Depression    Diabetes mellitus without complication (HCC)    Thyroid disease    Review of Systems:  Negative except as stated in HPI.  Physical Exam:  Vitals:   12/10/20 0947 12/10/20 1023  BP: (!) 152/91 111/81  Pulse: 91 91  Temp: 98.7 F (37.1 C)   TempSrc: Oral   SpO2: 98% 100%  Weight: 271 lb 1.6 oz (123 kg)    Physical Exam  Constitutional: Appears well-developed and well-nourished. No distress.  Cardiovascular: Normal rate, regular rhythm, S1 and S2 present, no murmurs, rubs, gallops.  Distal pulses intact Respiratory: No respiratory distress, Effort is normal.  Lungs are clear to auscultation bilaterally. Musculoskeletal: Normal bulk and tone.  No peripheral edema noted. Neurological: Is alert and oriented x4, no apparent focal deficits noted. Skin: Warm and dry.  No rash, erythema, lesions noted. Psychiatric: Normal mood and affect.   Assessment & Plan:   See Encounters Tab for problem based charting.  Patient discussed with Dr. Heide Ramirez

## 2020-12-11 NOTE — Assessment & Plan Note (Signed)
Patient declined mammogram, pap smear and colonoscopy at this time.

## 2020-12-11 NOTE — Assessment & Plan Note (Addendum)
Patient has poorly controlled diabetes with most recent HbA1c 12.2. She is on Toujeo 42U daily, Novolog 30U tid with meals, Jardiance 10mg  daily and Ozempic 0.25mg  weekly. She reports checking her blood sugars at home frequently and reports fasting blood glucose >150 and post prandial readings to be up to 300.  Patient reports taking medication as prescribed; however, Ozempic is very costly ($400) despite assistance. She reports rationing it to make it last 2 months. Discussed changing to [facility] to help with the cost. She is agreeable to this.  Also advised patient for CGM monitoring; she reports trying this in the past but was frustrated with it falling off. Advised that adhesive properties of the CGM monitor have improved. She is still hesitant and wants to continue with fingersticks at this time.   Plan: Start Mounjaro 2.5mg  weekly for 4 weeks; uptitrate as tolerated Continue Toujeo 42U daily and Novolog 30 units daily with meals Continue Jardiance 10mg  daily Follow up in 1 week for glucose check and insulin adjustment Referral to ophthalmology

## 2020-12-15 NOTE — Progress Notes (Signed)
Internal Medicine Clinic Attending ° °Case discussed with Dr. Aslam  At the time of the visit.  We reviewed the resident’s history and exam and pertinent patient test results.  I agree with the assessment, diagnosis, and plan of care documented in the resident’s note.  °

## 2020-12-16 ENCOUNTER — Telehealth: Payer: Self-pay

## 2020-12-16 NOTE — Telephone Encounter (Signed)
PA for patient ( MOUNJARO 2.5 MG/0.5 ML PEN - INJECTOR) was sent throught from cover my meds was done and sent back with office notes from 10/12 DR ASLAM .  Awaiting approval or denial

## 2021-01-05 ENCOUNTER — Ambulatory Visit (INDEPENDENT_AMBULATORY_CARE_PROVIDER_SITE_OTHER): Payer: 59 | Admitting: Pharmacist

## 2021-01-05 DIAGNOSIS — E113313 Type 2 diabetes mellitus with moderate nonproliferative diabetic retinopathy with macular edema, bilateral: Secondary | ICD-10-CM

## 2021-01-05 DIAGNOSIS — Z794 Long term (current) use of insulin: Secondary | ICD-10-CM | POA: Diagnosis not present

## 2021-01-05 MED ORDER — MOUNJARO 5 MG/0.5ML ~~LOC~~ SOAJ: 5.0000 mg | SUBCUTANEOUS | 0 refills | Status: DC

## 2021-01-05 NOTE — Assessment & Plan Note (Signed)
T2DM is controlled based on home blood glucose readings, but will confirm with A1C at next visit. Medication adherence appears optimal. Patient tolerated first dose of Mounjaro well with no side effects therefore will send in prescription for increased dose starting on 01/26/21. Following instruction patient verbalized understanding of treatment plan.    1. Continued basal insulin Toujeo 42 units once daily  2. Continued rapid insulin Novolog to 30-32 units TID with meals  3. Continued Mounjaro 2.5mg  once weekly x3 doses and then increase to 5mg  once weekly 4. Continued SGLT2-I Jardiance 10mg  once daily 5. Extensively discussed pathophysiology of diabetes, dietary effects on blood sugar control, and recommended lifestyle interventions. 6. Counseled on s/sx of and management of hypoglycemia 7. Next A1C anticipated November 2022.   Follow-up appointment six weeks to review sugar readings.

## 2021-01-05 NOTE — Progress Notes (Signed)
   Subjective:    Patient ID: Kathy Ramirez, female    DOB: 1961-02-19, 60 y.o.   MRN: [id number]  HPI Patient is a 60 y.o. female who presents for diabetes management. She is in good spirits and presents with assistance of wheelchair and her husband. Patient was referred and last seen by provider, Dr. Mcarthur Rossetti on 12/10/20. Last pharmacy visit 10/27/20.  Insurance coverage/medication affordability: [employer]  Family/Social history: lives with husband  Current diabetes medications include: insulin aspart (Novolog) 30-32 units TID, insulin glargine (Toujeo) 42 units daily in evening, empagliflozin (Jardiance) 10mg  once daily, Mounjaro 2.5mg  once weekly Current hypertension medications include: N/a Current hyperlipidemia medications include: N/a  Patient states that She is taking her medications as prescribed. Patient reports adherence with medications.   Do you feel that your medications are working for you?  yes  Have you been experiencing any side effects to the medications prescribed? no  Do you have any problems obtaining medications due to transportation or finances?  Yes; Cost of Jardiance was $200   Patient denies hypoglycemic events. Patient reports polyuria (increased urination).  Patient denies polyphagia (increased appetite).  Patient denies polydipsia (increased thirst).  Patient reports neuropathy (nerve pain). Patient reports visual changes. Patient reports self foot exams.   Patient forgot meter at home but reports below readings: Home fasting blood sugars: 115-130; highest 150 2 hour post-meal/random blood sugars: 160-180; every now and then in 200's  Objective:   Labs:   Physical Exam Neurological:     Mental Status: She is alert and oriented to person, place, and time.   Review of Systems  Gastrointestinal:  Negative for nausea and vomiting.    Lab Results  Component Value Date   HGBA1C 12.2 (A) 10/07/2020   HGBA1C 12.3 (H) 04/07/2017    Lab Results   Component Value Date   MICRALBCREAT 18 10/07/2020    Lipid Panel     Component Value Date/Time   CHOL 169 10/07/2020 1036   TRIG 104 10/07/2020 1036   HDL 45 10/07/2020 1036   CHOLHDL 3.8 10/07/2020 1036   LDLCALC 105 (H) 10/07/2020 1036    Assessment/Plan:   T2DM is controlled based on home blood glucose readings, but will confirm with A1C at next visit. Medication adherence appears optimal. Patient tolerated first dose of Mounjaro well with no side effects therefore will send in prescription for increased dose starting on 01/26/21. Following instruction patient verbalized understanding of treatment plan.    Continued basal insulin Toujeo 42 units once daily  Continued rapid insulin Novolog to 30-32 units TID with meals  Continued Mounjaro 2.5mg  once weekly x3 doses and then increase to 5mg  once weekly Continued SGLT2-I Jardiance 10mg  once daily Extensively discussed pathophysiology of diabetes, dietary effects on blood sugar control, and recommended lifestyle interventions. Counseled on s/sx of and management of hypoglycemia Next A1C anticipated November 2022.   Patient did not present for updated thyroid labs based on last office visit therefore will have patient obtain at office visit with provider next week alongside A1C.  Follow-up appointment six weeks to review sugar readings. Written patient instructions provided.  This appointment required 30 minutes of direct patient care.  Thank you for involving pharmacy to assist in providing this patient's care.

## 2021-01-05 NOTE — Patient Instructions (Addendum)
Miss Kobrin it was a pleasure seeing you today.   Please do the following:  Continue your current medications as directed today during your appointment. If you have any questions or if you believe something has occurred because of this change, call me or your doctor to let one of [place] know.  Increase Mounjaro to 5mg  on 11/28 and take once weekly Continue checking blood sugars at home. It's really important that you record these and bring these in to your next doctor's appointment.  Continue making the lifestyle changes we've discussed together during our visit. Diet and exercise play a significant role in improving your blood sugars.  Follow-up with me in six weeks.   Hypoglycemia or low blood sugar:   Low blood sugar can happen quickly and may become an emergency if not treated right away.   While this shouldn't happen often, it can be brought upon if you skip a meal or do not eat enough. Also, if your insulin or other diabetes medications are dosed too high, this can cause your blood sugar to go to low.   Warning signs of low blood sugar include: Feeling shaky or dizzy Feeling weak or tired  Excessive hunger Feeling anxious or upset  Sweating even when you aren't exercising  What to do if I experience low blood sugar? Follow the Rule of 15 Check your blood sugar with your meter. If lower than 70, proceed to step 2.  Treat with 15 grams of fast acting carbs which is found in 3-4 glucose tablets. If none are available you can try hard candy, 1 tablespoon of sugar or honey,4 ounces of fruit juice, or 6 ounces of REGULAR soda.  Re-check your sugar in 15 minutes. If it is still below 70, do what you did in step 2 again. If your blood sugar has come back up, go ahead and eat a snack or small meal made up of complex carbs (ex. Whole grains) and protein at this time to avoid recurrence of low blood sugar.

## 2021-01-15 ENCOUNTER — Encounter: Payer: Self-pay | Admitting: Student

## 2021-01-15 ENCOUNTER — Ambulatory Visit (INDEPENDENT_AMBULATORY_CARE_PROVIDER_SITE_OTHER): Payer: 59 | Admitting: Student

## 2021-01-15 VITALS — BP 140/88 | HR 78 | Wt 278.1 lb

## 2021-01-15 DIAGNOSIS — G8929 Other chronic pain: Secondary | ICD-10-CM

## 2021-01-15 DIAGNOSIS — E113313 Type 2 diabetes mellitus with moderate nonproliferative diabetic retinopathy with macular edema, bilateral: Secondary | ICD-10-CM | POA: Diagnosis not present

## 2021-01-15 DIAGNOSIS — M545 Low back pain, unspecified: Secondary | ICD-10-CM

## 2021-01-15 DIAGNOSIS — Z794 Long term (current) use of insulin: Secondary | ICD-10-CM | POA: Diagnosis not present

## 2021-01-15 DIAGNOSIS — E559 Vitamin D deficiency, unspecified: Secondary | ICD-10-CM

## 2021-01-15 DIAGNOSIS — E039 Hypothyroidism, unspecified: Secondary | ICD-10-CM | POA: Diagnosis not present

## 2021-01-15 LAB — POCT GLYCOSYLATED HEMOGLOBIN (HGB A1C): Hemoglobin A1C: 6.9 % — AB (ref 4.0–5.6)

## 2021-01-15 LAB — GLUCOSE, CAPILLARY: Glucose-Capillary: 144 mg/dL — ABNORMAL HIGH (ref 70–99)

## 2021-01-15 MED ORDER — NOVOLOG FLEXPEN 100 UNIT/ML ~~LOC~~ SOPN: 25.0000 [IU] | PEN_INJECTOR | Freq: Three times a day (TID) | SUBCUTANEOUS | 1 refills | Status: DC

## 2021-01-15 NOTE — Assessment & Plan Note (Signed)
Assessment: Patient with longstanding history of back pain she states due to arthritis in her back.  Last imaging from 2009 showed mild disc space loss with intervertebral spurring at L1-L2 and L2-L3 and mild facet degenerative changes.  She states that she has different types of pain and that they occur with certain movements.  While this certainly may be secondary to arthritis, her pain is off of midline and seems to be more musculoskeletal today.  She states that she is limited in functionality secondary to her pain.  I believe that this limited mobility has led to worsening muscle tightness and pain.  I encourage patient to continue movement and exercises as well as encouraged her to take over-the-counter supplements such as scheduled Tylenol 650 mg every 6 hours, heating pads daily and over-the-counter Voltaren gel.  I believe she would benefit from physical therapy to help loosen the muscles and with further exercises to do at home.  I think that this will certainly improve her overall outcomes.  Low suspicion that this is disc herniation or nerve compression.  Negative straight leg test.  Negative for radiation of her pain down her legs.  Denies red flag symptoms of fever, loss of bowel or bladder function, perianal numbness and tingling.  Because she states the pain is worsened over the past few years I do believe she would benefit from repeat imaging however patient would like to wait until the new year for this to occur.  Plan: -Continue duloxetine 60 mg daily -Referral to PT placed -Consider imaging in the coming months to reassess for worsening arthritis

## 2021-01-15 NOTE — Progress Notes (Signed)
CC: Follow-up diabetes, hypertension, chronic back pain  HPI:  Kathy Ramirez is a 60 y.o. female with a past medical history stated below and presents today for follow-up of her chronic medical conditions of diabetes, hypertension, chronic back pain. Please see problem based assessment and plan for additional details.  Past Medical History:  Diagnosis Date   Depression    Diabetes mellitus without complication (HCC)    Thyroid disease     Current Outpatient Medications on File Prior to Visit  Medication Sig Dispense Refill   DULoxetine (CYMBALTA) 60 MG capsule Take 1 capsule (60 mg total) by mouth daily. 90 capsule 1   empagliflozin (JARDIANCE) 10 MG TABS tablet Take 1 tablet (10 mg total) by mouth daily before breakfast. 30 tablet 2   gabapentin (NEURONTIN) 300 MG capsule Take 1 capsule (300 mg total) by mouth 2 (two) times daily AND 2 capsules (600 mg total) at bedtime. 120 capsule 2   ipratropium (ATROVENT) 0.06 % nasal spray Place 2 sprays into both nostrils 4 (four) times daily. 15 mL 0   levothyroxine (SYNTHROID) 150 MCG tablet Take 1 tablet (150 mcg total) by mouth daily before breakfast. 90 tablet 2   [START ON 01/20/2021] tirzepatide (MOUNJARO) 5 MG/0.5ML Pen Inject 5 mg into the skin once a week. Start taking after you finish 2.5mg  2 mL 0   TOUJEO SOLOSTAR 300 UNIT/ML Solostar Pen Inject 42 Units into the skin daily. 9 mL 1   No current facility-administered medications on file prior to visit.    Family History  Problem Relation Age of Onset   Diabetes Mother    Alzheimer's disease Mother    Lung cancer Mother    Liver cancer Son     Social History   Socioeconomic History   Marital status: Married    Spouse name: Not on file   Number of children: Not on file   Years of education: Not on file   Highest education level: Not on file  Occupational History   Not on file  Tobacco Use   Smoking status: Former    Packs/day: 1.00    Years: 30.00    Pack years:  30.00    Types: Cigarettes    Quit date: 2012    Years since quitting: 10.8   Smokeless tobacco: Never   Tobacco comments:    quit 2012  Vaping Use   Vaping Use: Never used  Substance and Sexual Activity   Alcohol use: Yes    Comment: Drinks a couple ounces of rum most days of the week   Drug use: Never   Sexual activity: Not on file  Other Topics Concern   Not on file  Social History Narrative   Not on file   Social Determinants of Health   Financial Resource Strain: Not on file  Food Insecurity: Not on file  Transportation Needs: Not on file  Physical Activity: Not on file  Stress: Not on file  Social Connections: Not on file  Intimate Partner Violence: Not on file    Review of Systems: ROS negative except for what is noted on the assessment and plan.  Vitals:   01/15/21 1014  BP: 140/88  Pulse: 78  SpO2: 99%  Weight: 278 lb 1.6 oz (126.1 kg)   Physical Exam: Constitutional: Well-appearing, no acute distress HENT: normocephalic atraumatic Eyes: conjunctiva non-erythematous Neck: supple Cardiovascular: regular rate Pulmonary/Chest: normal work of breathing on room air MSK: normal bulk and tone.  She has tenderness to  palpation left lower back at midclavicular line. There is no midline tenderness, step-offs, deformities noted.  No erythema or warmth. Neurological: alert & oriented x 3.  Straight leg test negative bilaterally Skin: warm and dry Psych: Mood mood and thought process   Assessment & Plan:   See Encounters Tab for problem based charting.  Patient discussed with Dr.  Jeanine Luz, D.O. [facility] Internal [facility], PGY-2 Pager: [phone], Phone: [phone] Date 01/15/2021 Time 4:45 PM

## 2021-01-15 NOTE — Patient Instructions (Addendum)
Thank you, Ms.Andrian T Cygan for allowing [place] to provide your care today. Today we discussed   Chronic back pain For your back pain we will be referring you to physical therapy.  We can consider doing imaging in the future.  Please continue to try to be as mobile as possible as being less mobile will cause worsening pain and stiffness.  Please use Tylenol 650 mg 4 times a day, heating pads, over-the-counter Voltaren gel or lidocaine patches for any continued pain.  If you develop symptoms of losing bowel or bladder function, fevers chills, or numbness and tingling in your rectal area please call 911 or go to the hospital immediately.  Diabetes You are doing a fantastic job with your diabetes.  We will be decreasing your mealtime insulin to 25 units daily.  Please bring in your glucometer to next visit.  If you find yourself having persistent low blood sugar readings (below 80) please call our clinic immediately.  Thyroid disease Please continue taking her thyroid medication stop taking the biotin.  Vitamin deficiency Please continue to take your vitamin D supplement we will recheck your level in a few months.  Healthcare maintenance Please highly consider Pap smear and colonoscopy screening.  Also please consider vaccinations.  I have ordered the following labs for you:  Lab Orders         Glucose, capillary         TSH         POC Hbg A1C       Referrals ordered today:   Referral Orders         Ambulatory referral to Physical Therapy       I have ordered the following medication/changed the following medications:   Stop the following medications: Medications Discontinued During This Encounter  Medication Reason   NOVOLOG FLEXPEN 100 UNIT/ML FlexPen      Start the following medications: Meds ordered this encounter  Medications   NOVOLOG FLEXPEN 100 UNIT/ML FlexPen    Sig: Inject 25 Units into the skin 3 (three) times daily with meals.    Dispense:  15 mL    Refill:  1      Follow up:  2 weeks to 1 month follow up for diabetes       Should you have any questions or concerns please call the internal medicine clinic at [phone].    Thalia Bloodgood, D.O. [facility]

## 2021-01-15 NOTE — Assessment & Plan Note (Addendum)
Assessment: Current regimen of basal insulin Toujeo (glargine) 42 units daily, NovoLog 30 units 3 times daily with meals, tirzepatide 2.5 mg weekly, Jardiance 10 mg daily.  States that she checks her blood sugars 4 times a day.  She notes morning blood sugar readings are between 125-130.  She does endorse episodes of low sugars in the 70s to 80s where she feels shaky.  During these episodes she eats food higher in sugar.  She notes that most of these episodes occur after lunch and before dinner.  She denies having these episodes early in the morning when she wakes up.  Her A1c has significant improvement from 12, 7.1.  I congratulated the patient on this however there is some concern that with such a large drop she is having frequent episodes of hypoglycemia.  As such we will plan to decrease or mealtime insulin to 25 units 3 times daily with meals.  We will continue her on the other regimen as per above.  Also with patient increasing her tirzepatide she may notice a decrease in her weight and appetite.  If appetite continues to decrease will have to make certain insulin is adjusted accordingly to prevent episodes of hypoglycemia.  Patient given strict return precautions notices more frequent hypoglycemic episodes to call our clinic immediately.  We reviewed what her morning sugar range should be as well as what was considered a low blood sugar.  Plan: -Continue long-acting insulin glargine 42 units daily, Jardiance 10 mg daily and tirzepatide 2.5 mg weekly with plan to uptitrate -Decrease NovoLog to 25 units 3 times daily with meals -Repeat A1c in 3 months, encourage patient to bring in glucometer at every visit -Continue to encourage healthy diet and exercise -Follow-up in 2 weeks to 1 month to check glucose levels  Addendum: Called patient back to discuss when she would be increasing her tirzepatide.  She states that this is supposed to start on 22 November.  Patient also informed me that she felt  shaky after having a small meal for lunch and that she checked her sugars and they were in the 80s.  Discussed with her that if she plans on skipping meals or eating smaller portions she needs to avoid taking her mealtime insulin.  Because she will be increasing her tirzepatide to next week to 5 mg dosing, will go ahead and decrease mealtime insulin to 20 units with meals.  Also encourage patient that if she continues to have low blood sugar readings in the 80s or below 80 to call our clinic for further adjustments prior to her next visit.

## 2021-01-15 NOTE — Assessment & Plan Note (Addendum)
Assessment: Patient with hypothyroidism on 150 mcg levothyroxine daily.  In the past she was taking this with biotin and she was found to have TSH of 15.  We will repeat her TSH today as she has been adherent to this medication regimen and has stopped taking the biotin.  Plan: -Repeat TSH today -Continue levothyroxine 150 mcg daily  Addendum: TSH with improvement since few months ago

## 2021-01-16 LAB — TSH: TSH: 4.38 u[IU]/mL (ref 0.450–4.500)

## 2021-01-16 MED ORDER — NOVOLOG FLEXPEN 100 UNIT/ML ~~LOC~~ SOPN: 20.0000 [IU] | PEN_INJECTOR | Freq: Three times a day (TID) | SUBCUTANEOUS | 1 refills | Status: DC

## 2021-01-16 NOTE — Addendum Note (Signed)
Addended by: Belva Agee on: 01/16/2021 07:11 PM   Modules accepted: Orders

## 2021-01-19 NOTE — Progress Notes (Signed)
Internal Medicine Clinic Attending  Case discussed with Dr. Katsadouros  At the time of the visit.  We reviewed the resident's history and exam and pertinent patient test results.  I agree with the assessment, diagnosis, and plan of care documented in the resident's note.  

## 2021-02-09 ENCOUNTER — Ambulatory Visit (INDEPENDENT_AMBULATORY_CARE_PROVIDER_SITE_OTHER): Payer: 59 | Admitting: Internal Medicine

## 2021-02-09 ENCOUNTER — Encounter: Payer: 59 | Admitting: Pharmacist

## 2021-02-09 ENCOUNTER — Other Ambulatory Visit: Payer: Self-pay

## 2021-02-09 DIAGNOSIS — Z794 Long term (current) use of insulin: Secondary | ICD-10-CM

## 2021-02-09 DIAGNOSIS — Z0000 Encounter for general adult medical examination without abnormal findings: Secondary | ICD-10-CM

## 2021-02-09 DIAGNOSIS — I10 Essential (primary) hypertension: Secondary | ICD-10-CM | POA: Diagnosis not present

## 2021-02-09 DIAGNOSIS — E113313 Type 2 diabetes mellitus with moderate nonproliferative diabetic retinopathy with macular edema, bilateral: Secondary | ICD-10-CM

## 2021-02-09 MED ORDER — NOVOLOG FLEXPEN 100 UNIT/ML ~~LOC~~ SOPN: 15.0000 [IU] | PEN_INJECTOR | Freq: Three times a day (TID) | SUBCUTANEOUS | 1 refills | Status: DC

## 2021-02-09 MED ORDER — MOUNJARO 7.5 MG/0.5ML ~~LOC~~ SOAJ: 7.5000 mg | SUBCUTANEOUS | 0 refills | Status: DC

## 2021-02-09 MED ORDER — GABAPENTIN 300 MG PO CAPS: ORAL_CAPSULE | ORAL | 2 refills | Status: DC

## 2021-02-09 NOTE — Patient Instructions (Addendum)
Diabetes Good job! Your A1c number looked great during your previous visit. Continue taking your medications with changes below and following up with [place] in clinic with your meter: Go down on Novolog from 20 to 15 Increase Mounjaro from 5 to 7.5 weekly once you finish the 5  We will check your A1c in 2 months   Blood pressure It has been high during your last 2 visits. Please start measuring your BP at home and writing it down and bring during next visit.

## 2021-02-09 NOTE — Progress Notes (Signed)
CC: 1 mo DM f/u   HPI:  Kathy Ramirez is a 60 y.o. female with a PMHx stated below and presents today for stated above. Please see the Encounters tab for problem-based Assessment & Plan for additional details.   Past Medical History:  Diagnosis Date   Depression    Diabetes mellitus without complication (HCC)    Thyroid disease     Current Outpatient Medications on File Prior to Visit  Medication Sig Dispense Refill   DULoxetine (CYMBALTA) 60 MG capsule Take 1 capsule (60 mg total) by mouth daily. 90 capsule 1   empagliflozin (JARDIANCE) 10 MG TABS tablet Take 1 tablet (10 mg total) by mouth daily before breakfast. 30 tablet 2   gabapentin (NEURONTIN) 300 MG capsule Take 1 capsule (300 mg total) by mouth 2 (two) times daily AND 2 capsules (600 mg total) at bedtime. 120 capsule 2   ipratropium (ATROVENT) 0.06 % nasal spray Place 2 sprays into both nostrils 4 (four) times daily. 15 mL 0   levothyroxine (SYNTHROID) 150 MCG tablet Take 1 tablet (150 mcg total) by mouth daily before breakfast. 90 tablet 2   NOVOLOG FLEXPEN 100 UNIT/ML FlexPen Inject 20 Units into the skin 3 (three) times daily with meals. 15 mL 1   tirzepatide (MOUNJARO) 5 MG/0.5ML Pen Inject 5 mg into the skin once a week. Start taking after you finish 2.5mg  2 mL 0   TOUJEO SOLOSTAR 300 UNIT/ML Solostar Pen Inject 42 Units into the skin daily. 9 mL 1   No current facility-administered medications on file prior to visit.    Family History  Problem Relation Age of Onset   Diabetes Mother    Alzheimer's disease Mother    Lung cancer Mother    Liver cancer Son     Social History   Socioeconomic History   Marital status: Married    Spouse name: Not on file   Number of children: Not on file   Years of education: Not on file   Highest education level: Not on file  Occupational History   Not on file  Tobacco Use   Smoking status: Former    Packs/day: 1.00    Years: 30.00    Pack years: 30.00    Types:  Cigarettes    Quit date: 2012    Years since quitting: 10.9   Smokeless tobacco: Never   Tobacco comments:    quit 2012  Vaping Use   Vaping Use: Never used  Substance and Sexual Activity   Alcohol use: Yes    Comment: Drinks a couple ounces of rum most days of the week   Drug use: Never   Sexual activity: Not on file  Other Topics Concern   Not on file  Social History Narrative   Not on file   Social Determinants of Health   Financial Resource Strain: Not on file  Food Insecurity: Not on file  Transportation Needs: Not on file  Physical Activity: Not on file  Stress: Not on file  Social Connections: Not on file  Intimate Partner Violence: Not on file    Review of Systems: ROS negative except for what is noted on the assessment and plan.  Vitals:   02/09/21 0944  BP: 138/82  Pulse: 96  Temp: 98.5 F (36.9 C)  TempSrc: Oral  SpO2: 97%  Weight: 277 lb 6.4 oz (125.8 kg)  Height: 5\' 6"  (1.676 m)     Physical Exam: Constitutional: alert, well-appearing, in NAD Cardiovascular: RRR,  no m/r/g, 1+ bilateral LE edema present  Pulmonary/Chest: normal work of breathing on RA, LCTAB Abdominal: soft, non-tender to palpation, non-distended MSK: normal bulk and tone  Neurological: A&O x 3, 5/5 strength in bilateral upper and lower extremities  Skin: warm and dry  Psych: normal behavior, normal affect    Assessment & Plan:   See Encounters Tab for problem based charting.  Patient seen with Dr. Delrae Alfred, MD  Internal Medicine Resident, PGY-1 [facility]

## 2021-02-09 NOTE — Assessment & Plan Note (Addendum)
Flu and pneumococcal vaccine offered, pt declined  Pt declined colonoscopy referral and pap smear this visit

## 2021-02-09 NOTE — Progress Notes (Signed)
Medication Samples have been provided to the patient.  Drug name: jardiance       Strength: 10mg         Qty: 14 tablets  LOT:  Exp.Date: 8/23  Dosing instructions: take one tablet by mouth daily before breakfast  The patient has been instructed regarding the correct time, dose, and frequency of taking this medication, including desired effects and most common side effects.   9/23 11:36 AM 02/09/2021

## 2021-02-09 NOTE — Assessment & Plan Note (Addendum)
11/17 A1c 6.9. Regimen includes glargine 42 U qd, Jardiance 10 qd, Tirzepitide 5 U weekly, and Novolog 20 tid. Tirzepitide was increased from 2.5 to 5 weekly during office visit 1 mo ago, and Novolog tid was decreased from 30 to 20 U because of lows in 70s during mealtime. Reports good medication adherence with no adverse effects. Meter readings with average 115; highest was 134 and lowest 75. Pt reports feeling well on current regimen, stating she did not feel "woozy" like previously and did not experience any lows. Lifestyle modifications including diet and exercise encouraged. 1-2 lb weight loss; expect increased weight loss with up-titration of tirzepatide.  -Continue long-acting insulin glargine 42 units daily and Jardiance 10 mg daily -increase tirzepatide from 5 to 7.5 weekly  -Decrease NovoLog from 20 to 15 U tid with meal given low of 75 and increase in tirzepatide - A1c in 2 months, encourage patient to bring in glucometer at every visit -Continue to encourage healthy diet and exercise -Follow-up in 4 weeks to check glucose levels -Repeat A1c in 3 months, encourage patient to bring in glucometer at every visit

## 2021-02-09 NOTE — Assessment & Plan Note (Addendum)
BP elevated 138/82, and was elevated 140/88 during prior office visit 1 mo ago. Denies headaches, SHOB, and chest pain. Pt is not on any anti-hypertensive agent. Lifestyle modifications including diet, exercise and weight loss discussed. No significant proteinuria from 10/07/20  Advised pt to start logging BP at home and to bring during f/u visit. She expresses understanding.   Consider starting ACEi/ARB at f/u visit if appropriate

## 2021-02-11 ENCOUNTER — Other Ambulatory Visit (HOSPITAL_COMMUNITY): Payer: Self-pay

## 2021-02-11 NOTE — Progress Notes (Signed)
Received notification from [employer]) regarding patient assistance DENIAL for [employer].   PT HAS COMMERCIAL COVERAGE.  [employer] DOES OFFER A COPAY CARD FOR  COMMERCIALLY INSURED PATIENTS. PT'S COPAY WITH INSURANCE IS $200. THE COPAY CARD COVERS UP TO $175/MONTH.    Will f/u with copay card information asap.

## 2021-02-11 NOTE — Addendum Note (Signed)
Addended byCarmel Sacramento on: 02/11/2021 10:44 AM   Modules accepted: Level of Service

## 2021-02-11 NOTE — Progress Notes (Signed)
Internal Medicine Clinic Attending  I saw and evaluated the patient.  I personally confirmed the key portions of the history and exam documented by Dr. Patel and I reviewed pertinent patient test results.  The assessment, diagnosis, and plan were formulated together and I agree with the documentation in the resident's note.  

## 2021-02-12 ENCOUNTER — Encounter: Payer: 59 | Admitting: Pharmacist

## 2021-02-12 ENCOUNTER — Other Ambulatory Visit (HOSPITAL_COMMUNITY): Payer: Self-pay

## 2021-02-12 NOTE — Progress Notes (Signed)
Thanks [place]!

## 2021-02-12 NOTE — Progress Notes (Addendum)
Was able to run a test claim using her commercial insurance and a jardiance copay card. Pt pays $25 out of pocket. Will call pt to let her know.   COPAY CARD: BIN: [id number] PCN: [id number]: [id number] ID: [id number]

## 2021-02-12 NOTE — Progress Notes (Signed)
Attempted to call pt, no voicemail available.

## 2021-03-16 ENCOUNTER — Encounter: Payer: 59 | Admitting: Pharmacist

## 2021-03-23 ENCOUNTER — Encounter: Payer: 59 | Admitting: Pharmacist

## 2021-03-23 ENCOUNTER — Encounter: Payer: 59 | Admitting: Internal Medicine

## 2021-03-27 ENCOUNTER — Encounter: Payer: Self-pay | Admitting: Student

## 2021-03-27 ENCOUNTER — Ambulatory Visit (HOSPITAL_COMMUNITY)
Admission: RE | Admit: 2021-03-27 | Discharge: 2021-03-27 | Disposition: A | Discharge status: Home / Self Care | Payer: 59 | Source: Ambulatory Visit | Attending: Internal Medicine | Admitting: Internal Medicine

## 2021-03-27 ENCOUNTER — Other Ambulatory Visit: Payer: Self-pay

## 2021-03-27 ENCOUNTER — Telehealth: Payer: Self-pay

## 2021-03-27 ENCOUNTER — Encounter: Payer: 59 | Admitting: Internal Medicine

## 2021-03-27 ENCOUNTER — Ambulatory Visit: Payer: 59 | Admitting: Student

## 2021-03-27 VITALS — BP 137/65 | HR 85 | Temp 98.4°F | Ht 67.0 in | Wt 273.9 lb

## 2021-03-27 DIAGNOSIS — R002 Palpitations: Secondary | ICD-10-CM | POA: Insufficient documentation

## 2021-03-27 DIAGNOSIS — F411 Generalized anxiety disorder: Secondary | ICD-10-CM

## 2021-03-27 DIAGNOSIS — E039 Hypothyroidism, unspecified: Secondary | ICD-10-CM | POA: Diagnosis not present

## 2021-03-27 DIAGNOSIS — F329 Major depressive disorder, single episode, unspecified: Secondary | ICD-10-CM

## 2021-03-27 DIAGNOSIS — F331 Major depressive disorder, recurrent, moderate: Secondary | ICD-10-CM

## 2021-03-27 DIAGNOSIS — Z794 Long term (current) use of insulin: Secondary | ICD-10-CM

## 2021-03-27 DIAGNOSIS — I10 Essential (primary) hypertension: Secondary | ICD-10-CM

## 2021-03-27 DIAGNOSIS — E113313 Type 2 diabetes mellitus with moderate nonproliferative diabetic retinopathy with macular edema, bilateral: Secondary | ICD-10-CM

## 2021-03-27 MED ORDER — DULOXETINE HCL 60 MG PO CPEP: 60.0000 mg | ORAL_CAPSULE | Freq: Every day | ORAL | 1 refills | Status: DC

## 2021-03-27 MED ORDER — TOUJEO SOLOSTAR 300 UNIT/ML ~~LOC~~ SOPN: 42.0000 [IU] | PEN_INJECTOR | Freq: Every day | SUBCUTANEOUS | 1 refills | Status: DC

## 2021-03-27 MED ORDER — MOUNJARO 7.5 MG/0.5ML ~~LOC~~ SOAJ: 7.5000 mg | SUBCUTANEOUS | 0 refills | Status: DC

## 2021-03-27 MED ORDER — LEVOTHYROXINE SODIUM 150 MCG PO TABS: 150.0000 ug | ORAL_TABLET | Freq: Every day | ORAL | 2 refills | Status: DC

## 2021-03-27 MED ORDER — EMPAGLIFLOZIN 10 MG PO TABS: 10.0000 mg | ORAL_TABLET | Freq: Every day | ORAL | 2 refills | Status: DC

## 2021-03-27 MED ORDER — TIRZEPATIDE 5 MG/0.5ML ~~LOC~~ SOAJ: 5.0000 mg | SUBCUTANEOUS | 1 refills | Status: DC

## 2021-03-27 NOTE — Assessment & Plan Note (Addendum)
PHQ 9 elevated at 18 increased from 16 at last visit. Notes she has been feeling very frustrated and defeated as she has been unable to fill her diabetes mediations. Continue to take duloxetine 60 mg daily which she feels is helping. Would like to keep her current medications until after she can restart her medications.

## 2021-03-27 NOTE — Telephone Encounter (Signed)
Pa for pt ( MOUNJARO5MG /0.5ML PEN INJECTORS ) came through on cover my meds was submitted with office notes and labs from 03/27/21 .. awaiting approval or denial

## 2021-03-27 NOTE — Assessment & Plan Note (Signed)
BP elevated initially at 152/98 improved to 137/65. Has not been checking at home. Encouraged her to keep a BP log and bring to her next appointment. Continued to encourage lifestyle modification.

## 2021-03-27 NOTE — Progress Notes (Signed)
° °  CC: Diabetes follow up  HPI:  [name] is a 61 y.o. female with history below presents for follow up of diabetes follow up and palpitations. Please refer to problem based charting for further details and assessment and plan of current problem and chronic medical conditions.   Past Medical History:  Diagnosis Date   Depression    Diabetes mellitus without complication (HCC)    Thyroid disease    Review of Systems:  Negative as per HPI  Physical Exam:  Vitals:   03/27/21 1035 03/27/21 1109  BP: (!) 152/98 137/65  Pulse: 98 85  Temp: 98.4 F (36.9 C)   TempSrc: Oral   SpO2: 96%   Weight: 273 lb 14.4 oz (124.2 kg)   Height: 5\' 7"  (1.702 m)    Physical Exam Constitutional:      General: She is not in acute distress.    Appearance: Normal appearance. She is obese. She is not ill-appearing.  HENT:     Mouth/Throat:     Mouth: Mucous membranes are moist.     Pharynx: Oropharynx is clear.  Eyes:     Extraocular Movements: Extraocular movements intact.     Pupils: Pupils are equal, round, and reactive to light.  Cardiovascular:     Rate and Rhythm: Normal rate and regular rhythm.     Pulses: Normal pulses.     Heart sounds: No murmur heard.   No friction rub. No gallop.  Pulmonary:     Effort: Pulmonary effort is normal.     Breath sounds: No rhonchi or rales.  Abdominal:     General: Abdomen is flat. Bowel sounds are normal.     Palpations: Abdomen is soft.  Musculoskeletal:        General: Normal range of motion.     Right lower leg: Edema (2+) present.     Left lower leg: Edema (2+) present.  Skin:    General: Skin is warm and dry.     Capillary Refill: Capillary refill takes less than 2 seconds.     Findings: No erythema, lesion or rash.  Neurological:     General: No focal deficit present.     Mental Status: She is alert and oriented to person, place, and time.    Assessment & Plan:   See Encounters Tab for problem based charting.  Patient  discussed with Dr. 

## 2021-03-27 NOTE — Assessment & Plan Note (Addendum)
Expressed concern that she is taking too much levothyroxine. States she has been more intolerat of heat lately and reports palpitation for the past 2 weeks. Repeat TSH today.  TSH elevated at 5.8 and  T4 1.54, likely subclinical hypothyroidism. Continue on current medications

## 2021-03-27 NOTE — Patient Instructions (Signed)
It was a pleasure seeing you in clinic. Today we discussed:   Diabetes Please restart your munjaro 5 mg weekly. If going well we can increase to 7.5 mg weekly in 1 month Please go back to using 15 units of mealtime insulin with meals as you are restarting the munjaro Continue you other medications Please bring all your medications and your meter to the next visit Follow up in 1 month for A1c  Heart racing We will check labs today, you EKG looked good, if symptoms become more frequent and bothersome please let us know, you may benefit a longer heart rhythm monitor  BP monitor you BP at home and keep a log  If you have any questions or concerns, please call our clinic at [phone] between 9am-5pm and after hours call [phone] and ask for the internal medicine resident on call. If you feel you are having a medical emergency please call 911.   Thank you, we look forward to helping you remain healthy!

## 2021-03-27 NOTE — Assessment & Plan Note (Signed)
She has not been able to take tirzepatide for the since 12/19 as it has been on back order. Patient expressed frustration about this. Did not bring her meter to the office today. Reports fasting CBGs of 110-130s. Continue to use 42 units of Toujeo daily, using novolog 15 units with breakfast and lunch and 30 units if having a large dinner. Denies low readings on meter or hypoglycemic symptoms. I called [facility] and they do have tirezepatide 5 and 7.5 mg pens currently. Patient last on 5 mg weekly was unable to pick up 7.5 mg dose.  Plan Restart tirzepatide 5 mg weekly, if tolerating well can go up to 7.5mg  weekly Continue toujeo and novolog 15 units with meals after she restarts tirzepatide  Jardiance refilled Follow up in 1 month for A1c

## 2021-03-27 NOTE — Assessment & Plan Note (Addendum)
Notes feeling of her heart racing when she is laying down to take a nap. Occurs intermittently and improves with repositioning. Does not occur with exhaustion or any other activity. Racing ast a few seconds to minutes before resolving. Patient concerned about her thyroids dosage being higher than needed. Denies shortness of breath, dizziness, chest pain, weakness, nausea, or anxiety when having these symptoms. EKG in office in [facility]. Chest exam unremarkable. Not having symptoms in office today. Does have risk factors for AFIB given age, diabetes, body habitus. Husband in room reports patient occasionally snore but has not noticed any apneic events. Patient does not feel like he symptoms are particularly bothersome. Would prefer to hold of on longer cardiac monitoring to further evaluate this.   Plan Check TSH today  Will discuss further at next visit if symptoms are persistent or worsening  Addendum  TSH elevated with normal T4 likely subclinical

## 2021-03-29 LAB — TSH: TSH: 5.82 u[IU]/mL — ABNORMAL HIGH (ref 0.450–4.500)

## 2021-03-30 NOTE — Addendum Note (Signed)
Addended by: Quincy Simmonds on: 03/30/2021 09:32 AM   Modules accepted: Orders

## 2021-03-30 NOTE — Telephone Encounter (Signed)
DECISION : ( Doctor made aware )    on January 27  PA Case: [id number],   Status: Closed,   Closed Reason Code: FM Product not covered by this plan.   Prior Authorization not available.,   Closed Rationale: [employer] Prior Authorization team is unable to review this product for a coverage determination as the requested medication is not on the patient's formulary.   ...Marland KitchenMarland KitchenSo you may want to change this medication to something other type of diabetic medicine for pt

## 2021-03-30 NOTE — Progress Notes (Signed)
Internal Medicine Clinic Attending ? ?Case discussed with Dr. Liang  At the time of the visit.  We reviewed the resident?s history and exam and pertinent patient test results.  I agree with the assessment, diagnosis, and plan of care documented in the resident?s note. ? ?

## 2021-03-31 ENCOUNTER — Encounter (INDEPENDENT_AMBULATORY_CARE_PROVIDER_SITE_OTHER): Payer: Self-pay

## 2021-03-31 LAB — HM DIABETES EYE EXAM

## 2021-04-02 LAB — T4, FREE: Free T4: 1.54 ng/dL (ref 0.82–1.77)

## 2021-04-02 LAB — SPECIMEN STATUS REPORT

## 2021-04-02 NOTE — Telephone Encounter (Signed)
UPDATE :    [employer] sent over a form to fill to do another PA for medicine was done and submitted to [phone].Marland Kitchen awaiting approval or denial

## 2021-04-09 ENCOUNTER — Encounter (INDEPENDENT_AMBULATORY_CARE_PROVIDER_SITE_OTHER): Payer: 59 | Admitting: Ophthalmology

## 2021-04-15 ENCOUNTER — Encounter: Payer: Self-pay | Admitting: Dietician

## 2021-04-16 ENCOUNTER — Encounter (INDEPENDENT_AMBULATORY_CARE_PROVIDER_SITE_OTHER): Payer: Self-pay | Admitting: Ophthalmology

## 2021-04-16 ENCOUNTER — Ambulatory Visit (INDEPENDENT_AMBULATORY_CARE_PROVIDER_SITE_OTHER): Payer: 59 | Admitting: Ophthalmology

## 2021-04-16 ENCOUNTER — Other Ambulatory Visit: Payer: Self-pay

## 2021-04-16 DIAGNOSIS — E113413 Type 2 diabetes mellitus with severe nonproliferative diabetic retinopathy with macular edema, bilateral: Secondary | ICD-10-CM

## 2021-04-16 DIAGNOSIS — E113592 Type 2 diabetes mellitus with proliferative diabetic retinopathy without macular edema, left eye: Secondary | ICD-10-CM | POA: Diagnosis not present

## 2021-04-16 DIAGNOSIS — E113591 Type 2 diabetes mellitus with proliferative diabetic retinopathy without macular edema, right eye: Secondary | ICD-10-CM | POA: Insufficient documentation

## 2021-04-16 DIAGNOSIS — E113512 Type 2 diabetes mellitus with proliferative diabetic retinopathy with macular edema, left eye: Secondary | ICD-10-CM | POA: Insufficient documentation

## 2021-04-16 DIAGNOSIS — H2513 Age-related nuclear cataract, bilateral: Secondary | ICD-10-CM

## 2021-04-16 DIAGNOSIS — H43823 Vitreomacular adhesion, bilateral: Secondary | ICD-10-CM

## 2021-04-16 DIAGNOSIS — E113511 Type 2 diabetes mellitus with proliferative diabetic retinopathy with macular edema, right eye: Secondary | ICD-10-CM | POA: Diagnosis not present

## 2021-04-16 MED ORDER — FLUORESCEIN SODIUM 10 % IV SOLN
500.0000 mg | INTRAVENOUS | Status: AC | PRN
  Administered 2021-04-16: 500 mg via INTRAVENOUS

## 2021-04-16 NOTE — Assessment & Plan Note (Signed)
Mild to moderate OU, likely not causing CME

## 2021-04-16 NOTE — Assessment & Plan Note (Signed)
OS as OD will likely need intravitreal injection antivegF to control neovascular disease followed thereafter by PRP to the large regions of retinal nonperfusion for permanent control

## 2021-04-16 NOTE — Assessment & Plan Note (Signed)
OS as OD will likely need intravitreal injection antivegF to control neovascular disease followed thereafter by PRP to the large regions of retinal nonperfusion for permanent control °

## 2021-04-16 NOTE — Assessment & Plan Note (Signed)
OS, minor yet still affecting quality of vision.  Will likely improve as PDR is brought under control with an eye vegF then PRP

## 2021-04-16 NOTE — Assessment & Plan Note (Signed)
Ongoing management as per Shirleen Schirmer, PA under [facility] [place] eye care

## 2021-04-16 NOTE — Progress Notes (Signed)
04/16/2021     CHIEF COMPLAINT Patient presents for  Chief Complaint  Patient presents with   Retina Evaluation      HISTORY OF PRESENT ILLNESS: Kathy Ramirez is a 61 y.o. female who presents to the clinic today for:   HPI     Retina Evaluation           Laterality: both eyes   Associated Symptoms: Negative for Flashes and Floaters         Comments   NP DME, OD greater than OS, PDR OU, clearance for cataract surgery- Referred by Lundquist 03/31/2021. Pt states she has trouble seeing well. She does not drive anymore due to difficulty seeing. Pt states seeing at night time is extremely difficult. "I have trouble reading road signs. Sometimes I have a dullness of colors." LBS: Yesterday morning pt states it was 53. "It is not normally that low but I just started back on Mounjaro. I am on Novalog too. Normally it is in the low to mid 100's."       Last edited by Nelva NayKronstein, Anna N on 04/16/2021  8:20 AM.      Referring physician: Alma DownsLundquist, Albert, PA [address] [place] [id number]  HISTORICAL INFORMATION:   Selected notes from the MEDICAL RECORD NUMBER    Lab Results  Component Value Date   HGBA1C 6.9 (A) 01/15/2021     CURRENT MEDICATIONS: No current outpatient medications on file. (Ophthalmic Drugs)   No current facility-administered medications for this visit. (Ophthalmic Drugs)   Current Outpatient Medications (Other)  Medication Sig   DULoxetine (CYMBALTA) 60 MG capsule Take 1 capsule (60 mg total) by mouth daily.   empagliflozin (JARDIANCE) 10 MG TABS tablet Take 1 tablet (10 mg total) by mouth daily before breakfast.   gabapentin (NEURONTIN) 300 MG capsule Take 1 capsule (300 mg total) by mouth 2 (two) times daily AND 2 capsules (600 mg total) at bedtime.   ipratropium (ATROVENT) 0.06 % nasal spray Place 2 sprays into both nostrils 4 (four) times daily.   levothyroxine (SYNTHROID) 150 MCG tablet Take 1 tablet (150 mcg total) by mouth  daily before breakfast.   NOVOLOG FLEXPEN 100 UNIT/ML FlexPen Inject 15 Units into the skin 3 (three) times daily with meals.   tirzepatide [facility](MOUNJARO) 5 MG/0.5ML Pen Inject 5 mg into the skin once a week.   [START ON 04/27/2021] tirzepatide (MOUNJARO) 7.5 MG/0.5ML Pen Inject 7.5 mg into the skin once a week.   TOUJEO SOLOSTAR 300 UNIT/ML Solostar Pen Inject 42 Units into the skin daily.   No current facility-administered medications for this visit. (Other)      REVIEW OF SYSTEMS: ROS   Negative for: Constitutional, Gastrointestinal, Neurological, Skin, Genitourinary, Musculoskeletal, HENT, Endocrine, Cardiovascular, Eyes, Respiratory, Psychiatric, Allergic/Imm, Heme/Lymph Last edited by Edmon Crapeankin, Anouk Critzer A, MD on 04/16/2021 10:11 AM.       ALLERGIES Allergies  Allergen Reactions   Other Other (See Comments)    States ink from newspaper and coupons causes sneezing, watery eyes   Piper Other (See Comments)    States black pepper causes sneezing, watery eyes   Septra Ds [Sulfamethoxazole-Trimethoprim]     Heart racing, nausea, and headache   Shellfish-Derived Products Other (See Comments)    Sneezing, watery eyes, runny nose    PAST MEDICAL HISTORY Past Medical History:  Diagnosis Date   Depression    Diabetes mellitus without complication (HCC)    Thyroid disease    Past Surgical History:  Procedure  Laterality Date   CESAREAN SECTION     FOOT SURGERY Left     FAMILY HISTORY Family History  Problem Relation Age of Onset   Diabetes Mother    Alzheimer's disease Mother    Lung cancer Mother    Liver cancer Son     SOCIAL HISTORY Social History   Tobacco Use   Smoking status: Former    Packs/day: 1.00    Years: 30.00    Pack years: 30.00    Types: Cigarettes    Quit date: 2012    Years since quitting: 11.1   Smokeless tobacco: Never   Tobacco comments:    quit 2012  Vaping Use   Vaping Use: Never used  Substance Use Topics   Alcohol use: Yes    Comment:  Drinks a couple ounces of rum most days of the week   Drug use: Never         OPHTHALMIC EXAM:  Base Eye Exam     Visual Acuity (ETDRS)       Right Left   Dist [place] 20/40 20/60   Dist ph [place] NI 20/40 -1         Tonometry (Tonopen, 8:26 AM)       Right Left   Pressure 16 17         Pupils       Pupils Dark Light Shape React APD   Right PERRL 4 4 Round Minimal None   Left PERRL 4 4 Round Minimal None         Visual Fields (Counting fingers)       Left Right    Full Full         Extraocular Movement       Right Left    Full Full         Neuro/Psych     Oriented x3: Yes   Mood/Affect: Normal         Dilation     Both eyes: 1.0% Mydriacyl, 2.5% Phenylephrine @ 8:26 AM           Slit Lamp and Fundus Exam     External Exam       Right Left   External Normal Normal         Slit Lamp Exam       Right Left   Lids/Lashes Normal Normal   Conjunctiva/Sclera White and quiet White and quiet   Cornea Clear Clear   Anterior Chamber Deep and quiet Deep and quiet   Iris Round and reactive Round and reactive   Lens 1+ Nuclear sclerosis 1+ Nuclear sclerosis   Anterior Vitreous Normal Normal         Fundus Exam       Right Left   C/D Ratio 0.25 0.25   Macula Mild clinically significant macular edema, Microaneurysms Mild clinically significant macular edema, Microaneurysms   Vessels NPDR-Severe findings in all quadrants, with NVE nasal superonasal PDR in 1 quadrant NPDR-Severe findings in all quadrants, with NVE nasal superonasal PDR in 1 quadrant   Periphery Dot-blot hemorrhage, equator and anteriorly Dot-blot hemorrhage, equator and anteriorly            IMAGING AND PROCEDURES  Imaging and Procedures for 04/16/21  OCT, Retina - OU - Both Eyes       Right Eye Quality was good. Scan locations included subfoveal. Central Foveal Thickness: 417. Progression has no prior data. Findings include abnormal foveal contour, cystoid  macular edema, vitreomacular adhesion .  Left Eye Quality was good. Scan locations included subfoveal. Central Foveal Thickness: 399. Progression has no prior data. Findings include abnormal foveal contour, cystoid macular edema, vitreomacular adhesion .   Notes Bilateral centers involved CSME     Color Fundus Photography Optos - OU - Both Eyes       Right Eye Progression has no prior data. Disc findings include normal observations. Macula : microaneurysms. Periphery : hemorrhage.   Left Eye Progression has no prior data. Disc findings include normal observations. Macula : microaneurysms. Periphery : hemorrhage.   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 Mid/Late phase findings include vascular perfusion defect, microaneurysm, leakage. Choroidal neovascularization is not present.   Left Eye Mid/Late phase findings include leakage, microaneurysm, vascular perfusion defect. Choroidal neovascularization is not present.   Notes Bilateral early CSME.  Bilateral PDR with neovascularization elsewhere large fronds nasally and large regions of retinal capillary nonperfusion.  Will need antivegF OU followed thereafter by permanent PRP to be delivered to large regions of nonperfusion thus dying retina.             ASSESSMENT/PLAN:  Proliferative diabetic retinopathy of left eye (HCC) OS as OD will likely need intravitreal injection antivegF to control neovascular disease followed thereafter by PRP to the large regions of retinal nonperfusion for permanent control  Proliferative diabetic retinopathy, right eye (HCC) OS as OD will likely need intravitreal injection antivegF to control neovascular disease followed thereafter by PRP to the large regions of  retinal nonperfusion for permanent control  Nuclear sclerotic cataract of both eyes Ongoing management as per Alma Downs, PA under [facility]  Diabetic macular edema of right eye with proliferative retinopathy associated with type 2 diabetes mellitus (HCC) Will likely improve as PDR is treated with antivegF  Proliferative diabetic retinopathy of left eye with macular edema associated with type 2 diabetes mellitus (HCC) OS, minor yet still affecting quality of vision.  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 Proliferative diabetic retinopathy of right eye associated with type 2 diabetes mellitus, macular edema presence unspecified (HCC)  E11.3591     3. Proliferative diabetic retinopathy of left eye without macular edema associated with type 2 diabetes mellitus (HCC)  H08.6578 Fluorescein Angiography Optos (Transit OD)    Fluorescein Sodium 10 % injection 500 mg    4. Nuclear sclerotic cataract of both eyes  H25.13     5. Diabetic macular edema of right eye with proliferative retinopathy associated with type 2 diabetes mellitus (HCC)  E11.3511     6. Proliferative diabetic retinopathy of left eye with macular edema associated with type 2 diabetes mellitus (HCC)  I69.6295     7. Vitreomacular adhesion of both  eyes  H43.823       1.  OU with early PDR, large regional neovascularization nasal retina OU.  Large regional areas of nonperfusion OU nasal quadrants.  For this reason we will recommend  stabilization with intravitreal antivegF therapy OU followed thereafter by peripheral PRP initially nasal followed by temporal for permanent control of disease  2.  Once PDR and CSME is under control, may proceed with with further work regarding cataract extraction should it be required  3.  Ophthalmic Meds Ordered this visit:  Meds ordered this encounter  Medications   Fluorescein Sodium 10 % injection 500 mg       Return in about 1 week (around 04/23/2021) for dilate, OS, AVASTIN OCT,, and same day or same week dilate OD Avastin OCT.  There are no Patient Instructions on file for this visit.   Explained the diagnoses, plan, and follow up with the patient and they expressed understanding.  Patient expressed understanding of the importance of proper follow up care.   Alford Highland Hallie Ertl M.D. Diseases & Surgery of the Retina and Vitreous [facility] 04/16/21     Abbreviations: M myopia (nearsighted); A astigmatism; H hyperopia (farsighted); P presbyopia; Mrx spectacle prescription;  CTL contact lenses; OD right eye; OS left eye; OU both eyes  XT exotropia; ET esotropia; PEK punctate epithelial keratitis; PEE punctate epithelial erosions; DES dry eye syndrome; MGD meibomian gland dysfunction; ATs artificial tears; PFAT's preservative free artificial tears; NSC nuclear sclerotic cataract; PSC posterior subcapsular cataract; ERM epi-retinal membrane; PVD posterior vitreous detachment; RD retinal detachment; DM diabetes mellitus; DR diabetic retinopathy; NPDR non-proliferative diabetic retinopathy; PDR proliferative diabetic retinopathy; CSME clinically significant macular edema; DME diabetic macular edema; dbh dot blot hemorrhages; CWS cotton wool spot; POAG primary open angle glaucoma; C/D cup-to-disc ratio; HVF humphrey visual field; GVF goldmann visual field; OCT optical coherence tomography; IOP intraocular pressure; BRVO Branch retinal vein occlusion; CRVO central retinal  vein occlusion; CRAO central retinal artery occlusion; BRAO branch retinal artery occlusion; RT retinal tear; SB scleral buckle; PPV pars plana vitrectomy; VH Vitreous hemorrhage; PRP panretinal laser photocoagulation; IVK intravitreal kenalog; VMT vitreomacular traction; MH Macular hole;  NVD neovascularization of the disc; NVE neovascularization elsewhere; AREDS age related eye disease study; ARMD age related macular degeneration; POAG primary open angle glaucoma; EBMD epithelial/anterior basement membrane dystrophy; ACIOL anterior chamber intraocular lens; IOL intraocular lens; PCIOL posterior chamber intraocular lens; Phaco/IOL phacoemulsification with intraocular lens placement; PRK photorefractive keratectomy; LASIK laser assisted in situ keratomileusis; HTN hypertension; DM diabetes mellitus; COPD chronic obstructive pulmonary disease

## 2021-04-16 NOTE — Assessment & Plan Note (Signed)
Will likely improve as PDR is treated with antivegF

## 2021-04-21 ENCOUNTER — Encounter (INDEPENDENT_AMBULATORY_CARE_PROVIDER_SITE_OTHER): Payer: 59 | Admitting: Ophthalmology

## 2021-04-22 ENCOUNTER — Other Ambulatory Visit: Payer: Self-pay

## 2021-04-22 ENCOUNTER — Ambulatory Visit (INDEPENDENT_AMBULATORY_CARE_PROVIDER_SITE_OTHER): Payer: 59 | Admitting: Ophthalmology

## 2021-04-22 ENCOUNTER — Encounter (INDEPENDENT_AMBULATORY_CARE_PROVIDER_SITE_OTHER): Payer: Self-pay | Admitting: Ophthalmology

## 2021-04-22 DIAGNOSIS — E113511 Type 2 diabetes mellitus with proliferative diabetic retinopathy with macular edema, right eye: Secondary | ICD-10-CM | POA: Diagnosis not present

## 2021-04-22 DIAGNOSIS — E113512 Type 2 diabetes mellitus with proliferative diabetic retinopathy with macular edema, left eye: Secondary | ICD-10-CM | POA: Diagnosis not present

## 2021-04-22 DIAGNOSIS — H43823 Vitreomacular adhesion, bilateral: Secondary | ICD-10-CM

## 2021-04-22 DIAGNOSIS — H2513 Age-related nuclear cataract, bilateral: Secondary | ICD-10-CM

## 2021-04-22 DIAGNOSIS — E113591 Type 2 diabetes mellitus with proliferative diabetic retinopathy without macular edema, right eye: Secondary | ICD-10-CM

## 2021-04-22 MED ORDER — BEVACIZUMAB 2.5 MG/0.1ML IZ SOSY
2.5000 mg | PREFILLED_SYRINGE | INTRAVITREAL | Status: AC | PRN
  Administered 2021-04-22: 2.5 mg via INTRAVITREAL

## 2021-04-22 NOTE — Assessment & Plan Note (Signed)
Physiologic OU 

## 2021-04-22 NOTE — Assessment & Plan Note (Signed)
Center involved CSME, will commence with therapy today.  Worsened over the last 6 days.  Will need Avastin today and follow-up again in 5 weeks OD

## 2021-04-22 NOTE — Progress Notes (Signed)
04/22/2021     CHIEF COMPLAINT Patient presents for  Chief Complaint  Patient presents with   Diabetic Retinopathy with Macular Edema      HISTORY OF PRESENT ILLNESS: Kathy Ramirez is a 61 y.o. female who presents to the clinic today for:   HPI   1 week fu OD oct Avastin OD. Patient states vision is stable and unchanged since last visit. Denies any new floaters or FOL.  Last edited by Nelva Nay on 04/22/2021  8:59 AM.      Referring physician: Rehman, Areeg N, DO [address] [place],  [place] [address]  HISTORICAL INFORMATION:   Selected notes from the MEDICAL RECORD NUMBER    Lab Results  Component Value Date   HGBA1C 6.9 (A) 01/15/2021     CURRENT MEDICATIONS: No current outpatient medications on file. (Ophthalmic Drugs)   No current facility-administered medications for this visit. (Ophthalmic Drugs)   Current Outpatient Medications (Other)  Medication Sig   DULoxetine (CYMBALTA) 60 MG capsule Take 1 capsule (60 mg total) by mouth daily.   empagliflozin (JARDIANCE) 10 MG TABS tablet Take 1 tablet (10 mg total) by mouth daily before breakfast.   gabapentin (NEURONTIN) 300 MG capsule Take 1 capsule (300 mg total) by mouth 2 (two) times daily AND 2 capsules (600 mg total) at bedtime.   ipratropium (ATROVENT) 0.06 % nasal spray Place 2 sprays into both nostrils 4 (four) times daily.   levothyroxine (SYNTHROID) 150 MCG tablet Take 1 tablet (150 mcg total) by mouth daily before breakfast.   NOVOLOG FLEXPEN 100 UNIT/ML FlexPen Inject 15 Units into the skin 3 (three) times daily with meals.   tirzepatide [facility]) 5 MG/0.5ML Pen Inject 5 mg into the skin once a week.   [START ON 04/27/2021] tirzepatide (MOUNJARO) 7.5 MG/0.5ML Pen Inject 7.5 mg into the skin once a week.   TOUJEO SOLOSTAR 300 UNIT/ML Solostar Pen Inject 42 Units into the skin daily.   No current facility-administered medications for this visit. (Other)      REVIEW OF SYSTEMS: ROS    Negative for: Constitutional, Gastrointestinal, Neurological, Skin, Genitourinary, Musculoskeletal, HENT, Endocrine, Cardiovascular, Eyes, Respiratory, Psychiatric, Allergic/Imm, Heme/Lymph Last edited by Edmon Crape, MD on 04/22/2021  9:37 AM.       ALLERGIES Allergies  Allergen Reactions   Other Other (See Comments)    States ink from newspaper and coupons causes sneezing, watery eyes   Piper Other (See Comments)    States black pepper causes sneezing, watery eyes   Septra Ds [Sulfamethoxazole-Trimethoprim]     Heart racing, nausea, and headache   Shellfish-Derived Products Other (See Comments)    Sneezing, watery eyes, runny nose    PAST MEDICAL HISTORY Past Medical History:  Diagnosis Date   Depression    Diabetes mellitus without complication (HCC)    Thyroid disease    Past Surgical History:  Procedure Laterality Date   CESAREAN SECTION     FOOT SURGERY Left     FAMILY HISTORY Family History  Problem Relation Age of Onset   Diabetes Mother    Alzheimer's disease Mother    Lung cancer Mother    Liver cancer Son     SOCIAL HISTORY Social History   Tobacco Use   Smoking status: Former    Packs/day: 1.00    Years: 30.00    Pack years: 30.00    Types: Cigarettes    Quit date: 2012    Years since quitting: 11.1  Smokeless tobacco: Never   Tobacco comments:    quit 2012  Vaping Use   Vaping Use: Never used  Substance Use Topics   Alcohol use: Yes    Comment: Drinks a couple ounces of rum most days of the week   Drug use: Never         OPHTHALMIC EXAM:  Base Eye Exam     Visual Acuity (ETDRS)       Right Left   Dist [place] 20/40 +1 20/60   Dist ph [place] 20/30 20/40 -1+2         Tonometry (Tonopen, 9:04 AM)       Right Left   Pressure 21 18         Pupils       Pupils Dark Light Shape React APD   Right PERRL 4 4 Round Minimal None   Left PERRL 4 4 Round Minimal None         Extraocular Movement       Right Left    Full Full          Neuro/Psych     Oriented x3: Yes   Mood/Affect: Normal         Dilation     Both eyes: 1.0% Mydriacyl, 2.5% Phenylephrine @ 9:04 AM           Slit Lamp and Fundus Exam     External Exam       Right Left   External Normal Normal         Slit Lamp Exam       Right Left   Lids/Lashes Normal Normal   Conjunctiva/Sclera White and quiet White and quiet   Cornea Clear Clear   Anterior Chamber Deep and quiet Deep and quiet   Iris Round and reactive Round and reactive   Lens 2+ Nuclear sclerosis 1+ Nuclear sclerosis   Anterior Vitreous Normal Normal         Fundus Exam       Right Left   Posterior Vitreous Normal    Disc Normal    C/D Ratio 0.25    Macula Mild clinically significant macular edema, Microaneurysms    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 9:37 AM. Confirmed correct patient, procedure, site, and patient consented.   Anesthesia Topical anesthesia was used. Anesthetic medications included Lidocaine 4%.   Procedure Preparation included 5% betadine to ocular surface, 10% betadine to eyelids. A 30 gauge needle was used.   Injection: 2.5 mg bevacizumab 2.5 MG/0.1ML   Route: Intravitreal, Site: Right Eye   NDC: [phone]   Post-op Post injection exam found visual acuity of at least counting fingers. The patient tolerated the procedure well. There were no complications. The patient received written and verbal post procedure care education. Post injection medications included ocuflox.      OCT, Retina - OU - Both Eyes       Right Eye Quality was good. Scan locations included subfoveal. Central Foveal Thickness: 466. Progression has no prior data. Findings  include abnormal foveal contour, cystoid macular edema, vitreomacular adhesion .   Left Eye Quality was good. Scan locations included subfoveal. Central  Foveal Thickness: 384. Progression has no prior data. Findings include abnormal foveal contour, cystoid macular edema, vitreomacular adhesion .   Notes Bilateral centers involved CSME             ASSESSMENT/PLAN:  Diabetic macular edema of right eye with proliferative retinopathy associated with type 2 diabetes mellitus (HCC) Center involved CSME, will commence with therapy today.  Worsened over the last 6 days.  Will need Avastin today and follow-up again in 5 weeks OD  Proliferative diabetic retinopathy of left eye with macular edema associated with type 2 diabetes mellitus (HCC) Mild CSME OS, will need Avastin soon  Vitreomacular adhesion of both eyes Physiologic OU  Nuclear sclerotic cataract of both eyes Moderate OU     ICD-10-CM   1. Proliferative diabetic retinopathy of right eye associated with type 2 diabetes mellitus, macular edema presence unspecified (HCC)  E11.3591 Intravitreal Injection, Pharmacologic Agent - OD - Right Eye    OCT, Retina - OU - Both Eyes    bevacizumab (AVASTIN) SOSY 2.5 mg    2. 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 Proliferative diabetic retinopathy of left eye with macular edema associated with type 2 diabetes mellitus (HCC)  K74.2595     4. Vitreomacular adhesion of both eyes  H43.823     5. Nuclear sclerotic cataract of both eyes  H25.13       1.  OD and OS, will need intravitreal Avastin so as to halt progression of center involved CSME.  Commence with intravitreal Avastin OD today.  2.  Procedure discussed in detail with the patient,  3.  Ophthalmic Meds Ordered this visit:  Meds ordered this encounter  Medications   bevacizumab (AVASTIN) SOSY 2.5 mg       Return in about 1  week (around 04/29/2021) for dilate, OS, AVASTIN OCT.  There are no Patient Instructions on file for this visit.   Explained the diagnoses, plan, and follow up with the patient and they expressed understanding.  Patient expressed understanding of the importance of proper follow up care.   Alford Highland Karris Deangelo M.D. Diseases & Surgery of the Retina and Vitreous [facility] 04/22/21     Abbreviations: M myopia (nearsighted); A astigmatism; H hyperopia (farsighted); P presbyopia; Mrx spectacle prescription;  CTL contact lenses; OD right eye; OS left eye; OU both eyes  XT exotropia; ET esotropia; PEK punctate epithelial keratitis; PEE punctate epithelial erosions; DES dry eye syndrome; MGD meibomian gland dysfunction; ATs artificial tears; PFAT's preservative free artificial tears; NSC nuclear sclerotic cataract; PSC posterior subcapsular cataract; ERM epi-retinal membrane; PVD posterior vitreous detachment; RD retinal detachment; DM diabetes mellitus; DR diabetic retinopathy; NPDR non-proliferative diabetic retinopathy; PDR proliferative diabetic retinopathy; CSME clinically significant macular edema; DME diabetic macular edema; dbh dot blot hemorrhages; CWS cotton wool spot; POAG primary open angle glaucoma; C/D cup-to-disc ratio; HVF humphrey visual field; GVF goldmann visual field; OCT optical coherence tomography; IOP intraocular pressure; BRVO Branch retinal vein occlusion; CRVO central retinal vein occlusion; CRAO central retinal artery occlusion; BRAO branch retinal artery occlusion; RT retinal tear; SB scleral buckle; PPV pars plana vitrectomy; VH Vitreous hemorrhage; PRP panretinal laser photocoagulation; IVK intravitreal kenalog; VMT vitreomacular traction; MH Macular hole;  NVD neovascularization of the disc; NVE neovascularization elsewhere; AREDS age related eye disease study; ARMD age related macular degeneration; POAG primary open angle glaucoma; EBMD epithelial/anterior basement  membrane dystrophy; ACIOL anterior  chamber intraocular lens; IOL intraocular lens; PCIOL posterior chamber intraocular lens; Phaco/IOL phacoemulsification with intraocular lens placement; [place] photorefractive keratectomy; LASIK laser assisted in situ keratomileusis; HTN hypertension; DM diabetes mellitus; COPD chronic obstructive pulmonary disease

## 2021-04-22 NOTE — Assessment & Plan Note (Signed)
Moderate OU 

## 2021-04-22 NOTE — Assessment & Plan Note (Signed)
Mild CSME OS, will need Avastin soon

## 2021-04-23 ENCOUNTER — Encounter (INDEPENDENT_AMBULATORY_CARE_PROVIDER_SITE_OTHER): Payer: 59 | Admitting: Ophthalmology

## 2021-04-27 ENCOUNTER — Other Ambulatory Visit: Payer: Self-pay

## 2021-04-27 ENCOUNTER — Ambulatory Visit (INDEPENDENT_AMBULATORY_CARE_PROVIDER_SITE_OTHER): Payer: 59 | Admitting: Ophthalmology

## 2021-04-27 ENCOUNTER — Encounter (INDEPENDENT_AMBULATORY_CARE_PROVIDER_SITE_OTHER): Payer: Self-pay | Admitting: Ophthalmology

## 2021-04-27 DIAGNOSIS — E113512 Type 2 diabetes mellitus with proliferative diabetic retinopathy with macular edema, left eye: Secondary | ICD-10-CM | POA: Diagnosis not present

## 2021-04-27 DIAGNOSIS — E113511 Type 2 diabetes mellitus with proliferative diabetic retinopathy with macular edema, right eye: Secondary | ICD-10-CM

## 2021-04-27 MED ORDER — BEVACIZUMAB 2.5 MG/0.1ML IZ SOSY
2.5000 mg | PREFILLED_SYRINGE | INTRAVITREAL | Status: AC | PRN
  Administered 2021-04-27: 2.5 mg via INTRAVITREAL

## 2021-04-27 NOTE — Assessment & Plan Note (Signed)
1 week post Avastin, sooner involved CSME slightly improved, will need PRP in the near future

## 2021-04-27 NOTE — Assessment & Plan Note (Signed)
Bilateral CSME and NVD and NVE document on recent fluorescein angiography, for injection intravitreal Avastin OS today, #1

## 2021-04-27 NOTE — Progress Notes (Signed)
04/27/2021     CHIEF COMPLAINT Patient presents for  Chief Complaint  Patient presents with   Diabetic Retinopathy with Macular Edema      HISTORY OF PRESENT ILLNESS: Kathy Ramirez is a 61 y.o. female who presents to the clinic today for:     Referring physician: Rehman, Areeg N, DO [address] [place],  [place] [address]  HISTORICAL INFORMATION:   Selected notes from the MEDICAL RECORD NUMBER    Lab Results  Component Value Date   HGBA1C 6.9 (A) 01/15/2021     CURRENT MEDICATIONS: No current outpatient medications on file. (Ophthalmic Drugs)   No current facility-administered medications for this visit. (Ophthalmic Drugs)   Current Outpatient Medications (Other)  Medication Sig   DULoxetine (CYMBALTA) 60 MG capsule Take 1 capsule (60 mg total) by mouth daily.   empagliflozin (JARDIANCE) 10 MG TABS tablet Take 1 tablet (10 mg total) by mouth daily before breakfast.   gabapentin (NEURONTIN) 300 MG capsule Take 1 capsule (300 mg total) by mouth 2 (two) times daily AND 2 capsules (600 mg total) at bedtime.   ipratropium (ATROVENT) 0.06 % nasal spray Place 2 sprays into both nostrils 4 (four) times daily.   levothyroxine (SYNTHROID) 150 MCG tablet Take 1 tablet (150 mcg total) by mouth daily before breakfast.   NOVOLOG FLEXPEN 100 UNIT/ML FlexPen Inject 15 Units into the skin 3 (three) times daily with meals.   tirzepatide [facility]) 5 MG/0.5ML Pen Inject 5 mg into the skin once a week.   tirzepatide (MOUNJARO) 7.5 MG/0.5ML Pen Inject 7.5 mg into the skin once a week.   TOUJEO SOLOSTAR 300 UNIT/ML Solostar Pen Inject 42 Units into the skin daily.   No current facility-administered medications for this visit. (Other)      REVIEW OF SYSTEMS: ROS   Negative for: Constitutional, Gastrointestinal, Neurological, Skin, Genitourinary, Musculoskeletal, HENT, Endocrine, Cardiovascular, Eyes, Respiratory, Psychiatric, Allergic/Imm, Heme/Lymph Last edited by Hurman Horn,  MD on 04/27/2021  9:26 AM.       ALLERGIES Allergies  Allergen Reactions   Other Other (See Comments)    States ink from newspaper and coupons causes sneezing, watery eyes   Piper Other (See Comments)    States black pepper causes sneezing, watery eyes   Septra Ds [Sulfamethoxazole-Trimethoprim]     Heart racing, nausea, and headache   Shellfish-Derived Products Other (See Comments)    Sneezing, watery eyes, runny nose    PAST MEDICAL HISTORY Past Medical History:  Diagnosis Date   Depression    Diabetes mellitus without complication ([place])    Thyroid disease    Past Surgical History:  Procedure Laterality Date   CESAREAN SECTION     FOOT SURGERY Left     FAMILY HISTORY Family History  Problem Relation Age of Onset   Diabetes Mother    Alzheimer's disease Mother    Lung cancer Mother    Liver cancer Son     SOCIAL HISTORY Social History   Tobacco Use   Smoking status: Former    Packs/day: 1.00    Years: 30.00    Pack years: 30.00    Types: Cigarettes    Quit date: 2012    Years since quitting: 11.1   Smokeless tobacco: Never   Tobacco comments:    quit 2012  Vaping Use   Vaping Use: Never used  Substance Use Topics   Alcohol use: Yes    Comment: Drinks a couple ounces of rum most days of the  week   Drug use: Never         OPHTHALMIC EXAM:  Base Eye Exam     Visual Acuity (ETDRS)       Right Left   Dist [place] 20/40 +1 20/70 -1   Dist ph [place] 20/70 -2         Tonometry (Tonopen, 9:30 AM)       Right Left   Pressure 19 14         Pupils       Pupils   Right PERRL   Left PERRL         Visual Fields       Left Right    Full Full         Extraocular Movement       Right Left    Full, Ortho Full, Ortho         Neuro/Psych     Oriented x3: Yes   Mood/Affect: Normal         Dilation     Left eye: 1.0% Mydriacyl, 2.5% Phenylephrine @ 9:28 AM           Slit Lamp and Fundus Exam     External Exam        Right Left   External Normal Normal         Slit Lamp Exam       Right Left   Lids/Lashes Normal Normal   Conjunctiva/Sclera White and quiet White and quiet   Cornea Clear Clear   Anterior Chamber Deep and quiet Deep and quiet   Iris Round and reactive Round and reactive   Lens 1+ Nuclear sclerosis 1+ Nuclear sclerosis   Anterior Vitreous Normal Normal         Fundus Exam       Right Left   C/D Ratio  0.25   Macula  Mild clinically significant macular edema, Microaneurysms   Vessels  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 Scan locations included subfoveal. Progression has no prior data. Findings include abnormal foveal contour, cystoid macular edema, vitreomacular adhesion .   Left Eye Quality was good. Scan locations included subfoveal. Central Foveal Thickness: 430. Progression has no prior data. Findings include abnormal foveal contour, cystoid macular edema, vitreomacular adhesion .   Notes Bilateral centers involved CSME     Intravitreal Injection, Pharmacologic Agent - OS - Left Eye       Time Out 04/27/2021. 9:30 AM. Confirmed correct patient, procedure, site, and patient consented.   Anesthesia Topical anesthesia was used. Anesthetic medications included Lidocaine 4%.   Procedure Preparation included 5% betadine to ocular surface, 10% betadine to eyelids. A 30 gauge needle was used.   Injection: 2.5 mg bevacizumab 2.5 MG/0.1ML   Route: Intravitreal, Site: Left Eye   NDC: [phone], Lot: [id number]   Post-op Post injection exam found visual acuity of at least counting fingers. The patient tolerated the procedure well. There were no complications. The patient received written and verbal post procedure care  education. Post injection medications were not given.              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 Proliferative diabetic retinopathy of left eye with macular edema associated with type 2 diabetes mellitus (HCC)  [id number] OCT, Retina - OU - Both Eyes    Intravitreal Injection, Pharmacologic Agent - OS - Left Eye    bevacizumab (AVASTIN) SOSY 2.5 mg    2. 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  Center involved CSME OS, for intravitreal Avastin today also to calm PDR  2.  Less CSME OD today 1 week post recent injection Avastin, will need peripheral PRP within a week OD and finally will need  3.  Peripheral PRP OS will also be needed  Ophthalmic Meds Ordered this visit:  Meds ordered this encounter  Medications   bevacizumab (AVASTIN) SOSY 2.5 mg       Return in about 1 week (around 05/04/2021) for dilate, OD, PRP,,, and dilate OS PRP 2 weeks.  There are no Patient Instructions on file for this visit.   Explained the diagnoses, plan, and follow up with the patient and they expressed understanding.  Patient expressed understanding of the importance of proper follow up care.   Clent Demark Garron Eline M.D. Diseases & Surgery of the Retina and Vitreous [facility] 04/27/21     Abbreviations: M myopia (nearsighted); A astigmatism; H hyperopia (farsighted); P presbyopia; Mrx spectacle prescription;  CTL contact lenses; OD right  eye; OS left eye; OU both eyes  XT exotropia; ET esotropia; PEK punctate epithelial keratitis; PEE punctate epithelial erosions; DES dry eye syndrome; MGD meibomian gland dysfunction; ATs artificial tears; PFAT's preservative free artificial tears; [place] nuclear sclerotic cataract; PSC posterior subcapsular cataract; ERM epi-retinal membrane; PVD posterior vitreous detachment; RD retinal detachment; DM diabetes mellitus; DR diabetic retinopathy; NPDR non-proliferative diabetic retinopathy; PDR proliferative diabetic retinopathy; CSME clinically significant macular edema; DME diabetic macular edema; dbh dot blot hemorrhages; CWS cotton wool spot; POAG primary open angle glaucoma; C/D cup-to-disc ratio; HVF humphrey visual field; GVF goldmann visual field; OCT optical coherence tomography; IOP intraocular pressure; BRVO Branch retinal vein occlusion; CRVO central retinal vein occlusion; CRAO central retinal artery occlusion; BRAO branch retinal artery occlusion; RT retinal tear; SB scleral buckle; PPV pars plana vitrectomy; VH Vitreous hemorrhage; PRP panretinal laser photocoagulation; IVK intravitreal kenalog; VMT vitreomacular traction; MH Macular hole;  NVD neovascularization of the disc; NVE neovascularization elsewhere; AREDS age related eye disease study; ARMD age related macular degeneration; POAG primary open angle glaucoma; EBMD epithelial/anterior basement membrane dystrophy; ACIOL anterior chamber intraocular lens; IOL intraocular lens; PCIOL posterior chamber intraocular lens; Phaco/IOL phacoemulsification with intraocular lens placement; [place] photorefractive keratectomy; LASIK laser assisted in situ keratomileusis; HTN hypertension; DM diabetes mellitus; COPD chronic obstructive pulmonary disease

## 2021-04-28 ENCOUNTER — Encounter: Payer: 59 | Admitting: Internal Medicine

## 2021-04-29 ENCOUNTER — Encounter (INDEPENDENT_AMBULATORY_CARE_PROVIDER_SITE_OTHER): Payer: 59 | Admitting: Ophthalmology

## 2021-05-04 ENCOUNTER — Encounter: Payer: 59 | Admitting: Student

## 2021-05-07 ENCOUNTER — Other Ambulatory Visit: Payer: Self-pay

## 2021-05-07 ENCOUNTER — Encounter (INDEPENDENT_AMBULATORY_CARE_PROVIDER_SITE_OTHER): Payer: Self-pay | Admitting: Ophthalmology

## 2021-05-07 ENCOUNTER — Ambulatory Visit (INDEPENDENT_AMBULATORY_CARE_PROVIDER_SITE_OTHER): Payer: 59 | Admitting: Ophthalmology

## 2021-05-07 DIAGNOSIS — E113511 Type 2 diabetes mellitus with proliferative diabetic retinopathy with macular edema, right eye: Secondary | ICD-10-CM

## 2021-05-07 NOTE — Assessment & Plan Note (Signed)
OD, PRP #1 completed superiorly and nasally today ?

## 2021-05-07 NOTE — Progress Notes (Signed)
05/07/2021     CHIEF COMPLAINT Patient presents for  Chief Complaint  Patient presents with   Diabetic Retinopathy with Macular Edema      HISTORY OF PRESENT ILLNESS: Kathy Ramirez is a 61 y.o. female who presents to the clinic today for:   HPI   1 week dilate OD, PRP. Pt states "the last visit I had an injection in the left eye. I used the eye drops, Systane after the visit. I started feeling a slight pain in my eye sockets the next day, and a little bit of a headache. I was fine the day after that and it hasn't bothered me since then." There were a few little spurts of floaters the evening and necxt day after the injection but they went away. Pt states she thought she noticed improvement in her vision but states "it could just be that my sugar was low." Last edited by Nelva NayKronstein, Anna N on 05/07/2021  9:05 AM.      Referring physician: Rehman, Areeg N, DO [address] [place],  [place] [id number]  HISTORICAL INFORMATION:   Selected notes from the MEDICAL RECORD NUMBER    Lab Results  Component Value Date   HGBA1C 6.9 (A) 01/15/2021     CURRENT MEDICATIONS: No current outpatient medications on file. (Ophthalmic Drugs)   No current facility-administered medications for this visit. (Ophthalmic Drugs)   Current Outpatient Medications (Other)  Medication Sig   DULoxetine (CYMBALTA) 60 MG capsule Take 1 capsule (60 mg total) by mouth daily.   empagliflozin (JARDIANCE) 10 MG TABS tablet Take 1 tablet (10 mg total) by mouth daily before breakfast.   gabapentin (NEURONTIN) 300 MG capsule Take 1 capsule (300 mg total) by mouth 2 (two) times daily AND 2 capsules (600 mg total) at bedtime.   ipratropium (ATROVENT) 0.06 % nasal spray Place 2 sprays into both nostrils 4 (four) times daily.   levothyroxine (SYNTHROID) 150 MCG tablet Take 1 tablet (150 mcg total) by mouth daily before breakfast.   NOVOLOG FLEXPEN 100 UNIT/ML FlexPen Inject 15 Units into the skin 3 (three) times daily  with meals.   tirzepatide [facility](MOUNJARO) 5 MG/0.5ML Pen Inject 5 mg into the skin once a week.   tirzepatide (MOUNJARO) 7.5 MG/0.5ML Pen Inject 7.5 mg into the skin once a week.   TOUJEO SOLOSTAR 300 UNIT/ML Solostar Pen Inject 42 Units into the skin daily.   No current facility-administered medications for this visit. (Other)      REVIEW OF SYSTEMS: ROS   Negative for: Constitutional, Gastrointestinal, Neurological, Skin, Genitourinary, Musculoskeletal, HENT, Endocrine, Cardiovascular, Eyes, Respiratory, Psychiatric, Allergic/Imm, Heme/Lymph Last edited by Edmon Crapeankin, Dicie Edelen A, MD on 05/07/2021  9:27 AM.       ALLERGIES Allergies  Allergen Reactions   Other Other (See Comments)    States ink from newspaper and coupons causes sneezing, watery eyes   Piper Other (See Comments)    States black pepper causes sneezing, watery eyes   Septra Ds [Sulfamethoxazole-Trimethoprim]     Heart racing, nausea, and headache   Shellfish-Derived Products Other (See Comments)    Sneezing, watery eyes, runny nose    PAST MEDICAL HISTORY Past Medical History:  Diagnosis Date   Depression    Diabetes mellitus without complication (HCC)    Thyroid disease    Past Surgical History:  Procedure Laterality Date   CESAREAN SECTION     FOOT SURGERY Left     FAMILY HISTORY Family History  Problem Relation Age of Onset  Diabetes Mother    Alzheimer's disease Mother    Lung cancer Mother    Liver cancer Son     SOCIAL HISTORY Social History   Tobacco Use   Smoking status: Former    Packs/day: 1.00    Years: 30.00    Pack years: 30.00    Types: Cigarettes    Quit date: 2012    Years since quitting: 11.1   Smokeless tobacco: Never   Tobacco comments:    quit 2012  Vaping Use   Vaping Use: Never used  Substance Use Topics   Alcohol use: Yes    Comment: Drinks a couple ounces of rum most days of the week   Drug use: Never         OPHTHALMIC EXAM:  Base Eye Exam     Visual Acuity  (ETDRS)       Right Left   Dist [place] 20/40 20/60 +2   Dist ph [place] NI 20/50 -1         Tonometry (Tonopen, 9:10 AM)       Right Left   Pressure 14 13         Pupils       Pupils APD   Right PERRL None   Left PERRL None         Visual Fields       Left Right    Full Full         Extraocular Movement       Right Left    Full Full         Neuro/Psych     Oriented x3: Yes   Mood/Affect: Normal         Dilation     Right eye: 1.0% Mydriacyl, 2.5% Phenylephrine @ 9:10 AM           Slit Lamp and Fundus Exam     External Exam       Right Left   External Normal Normal         Slit Lamp Exam       Right Left   Lids/Lashes Normal Normal   Conjunctiva/Sclera White and quiet White and quiet   Cornea Clear Clear   Anterior Chamber Deep and quiet Deep and quiet   Iris Round and reactive Round and reactive   Lens 2+ Nuclear sclerosis 1+ Nuclear sclerosis   Anterior Vitreous Normal Normal         Fundus Exam       Right Left   Posterior Vitreous Normal    Disc Normal    C/D Ratio 0.25    Macula Mild clinically significant macular edema, Microaneurysms    Vessels NPDR-Severe findings in all quadrants, with NVE nasal superonasal PDR in 1 quadrant    Periphery Dot-blot hemorrhage, equator and anteriorly             IMAGING AND PROCEDURES  Imaging and Procedures for 05/07/21  Panretinal Photocoagulation - OD - Right Eye       Time Out Confirmed correct patient, procedure, site, and patient consented.   Anesthesia Topical anesthesia was used. Anesthetic medications included Proparacaine 0.5%.   Laser Information The type of laser was diode. Color was yellow. The duration in seconds was 0.03. The spot size was 390 microns. Laser power was 280. Total spots was 624.   Post-op The patient tolerated the procedure well. There were no complications. The patient received written and verbal post procedure care education.   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  OD stabilized retinopathy status post recent injection Avastin.  Today for PRP #1 to complete the and began the process of inducing regression permanently of PDR  2 OS post recent injection Avastin, will commence with PRP in 10 days or so  3.  Ophthalmic Meds Ordered this visit:  No orders of the defined types were placed in this encounter.      Return in about 11 days (around 05/18/2021) for dilate, OS, PRP.  There are no Patient Instructions on file for this visit.   Explained the diagnoses, plan, and follow up with the patient and they expressed understanding.  Patient expressed understanding of the importance of proper follow up care.   Alford Highland Sebert Stollings M.D. Diseases & Surgery of the Retina and Vitreous [facility] 05/07/21     Abbreviations: M myopia (nearsighted); A astigmatism; H hyperopia (farsighted); P presbyopia; Mrx spectacle prescription;  CTL contact lenses; OD right eye; OS left eye; OU both eyes  XT exotropia; ET esotropia; PEK punctate epithelial keratitis; PEE punctate epithelial erosions; DES dry eye syndrome; MGD meibomian gland dysfunction; ATs artificial tears; PFAT's preservative free artificial tears; NSC nuclear sclerotic cataract; PSC posterior subcapsular cataract; ERM epi-retinal membrane; PVD posterior vitreous detachment; RD retinal detachment; DM diabetes mellitus; DR diabetic retinopathy; NPDR non-proliferative diabetic retinopathy; PDR proliferative diabetic retinopathy; CSME clinically  significant macular edema; DME diabetic macular edema; dbh dot blot hemorrhages; CWS cotton wool spot; POAG primary open angle glaucoma; C/D cup-to-disc ratio; HVF humphrey visual field; GVF goldmann visual field; OCT optical coherence tomography; IOP intraocular pressure; BRVO Branch retinal vein occlusion; CRVO central retinal vein occlusion; CRAO central retinal artery occlusion; BRAO branch retinal artery occlusion; RT retinal tear; SB scleral buckle; PPV pars plana vitrectomy; VH Vitreous hemorrhage; PRP panretinal laser photocoagulation; IVK intravitreal kenalog; VMT vitreomacular traction; MH Macular hole;  NVD neovascularization of the disc; NVE neovascularization elsewhere; AREDS age related eye disease study; ARMD age related macular degeneration; POAG primary open angle glaucoma; EBMD epithelial/anterior basement membrane dystrophy; ACIOL anterior chamber intraocular lens; IOL intraocular lens; PCIOL posterior chamber intraocular lens; Phaco/IOL phacoemulsification with intraocular lens placement; PRK photorefractive keratectomy; LASIK laser assisted in situ keratomileusis; HTN hypertension; DM diabetes mellitus; COPD chronic obstructive pulmonary disease

## 2021-05-13 ENCOUNTER — Encounter (INDEPENDENT_AMBULATORY_CARE_PROVIDER_SITE_OTHER): Payer: 59 | Admitting: Ophthalmology

## 2021-05-18 ENCOUNTER — Other Ambulatory Visit: Payer: Self-pay

## 2021-05-18 ENCOUNTER — Encounter (INDEPENDENT_AMBULATORY_CARE_PROVIDER_SITE_OTHER): Payer: Self-pay | Admitting: Ophthalmology

## 2021-05-18 ENCOUNTER — Ambulatory Visit (INDEPENDENT_AMBULATORY_CARE_PROVIDER_SITE_OTHER): Payer: 59 | Admitting: Ophthalmology

## 2021-05-18 DIAGNOSIS — E113511 Type 2 diabetes mellitus with proliferative diabetic retinopathy with macular edema, right eye: Secondary | ICD-10-CM

## 2021-05-18 DIAGNOSIS — E113512 Type 2 diabetes mellitus with proliferative diabetic retinopathy with macular edema, left eye: Secondary | ICD-10-CM

## 2021-05-18 NOTE — Assessment & Plan Note (Signed)
Center involved CSME has improved, no new symptoms post PRP #1 delivered 11 days previous ?

## 2021-05-18 NOTE — Assessment & Plan Note (Signed)
OS involved vastly improved postinjection from 1 month previous, PDR demonstrated nasal to the nerve, via FFA, for PRP easily today to maintain control ?

## 2021-05-18 NOTE — Progress Notes (Signed)
05/18/2021     CHIEF COMPLAINT Patient presents for  Chief Complaint  Patient presents with   Diabetic Retinopathy with Macular Edema      HISTORY OF PRESENT ILLNESS: Kathy Ramirez is a 61 y.o. female who presents to the clinic today for:   HPI   11 days for dilate os, prp. Pt states slight changes in vision. Pt states it has improved slightly. Pt is seeing floaters but denies FOL. Last edited by Angeline Slim on 05/18/2021  8:40 AM.      Referring physician: Rehman, Areeg N, DO [address] [place],  [place] [address]  HISTORICAL INFORMATION:   Selected notes from the MEDICAL RECORD NUMBER    Lab Results  Component Value Date   HGBA1C 6.9 (A) 01/15/2021     CURRENT MEDICATIONS: No current outpatient medications on file. (Ophthalmic Drugs)   No current facility-administered medications for this visit. (Ophthalmic Drugs)   Current Outpatient Medications (Other)  Medication Sig   DULoxetine (CYMBALTA) 60 MG capsule Take 1 capsule (60 mg total) by mouth daily.   empagliflozin (JARDIANCE) 10 MG TABS tablet Take 1 tablet (10 mg total) by mouth daily before breakfast.   gabapentin (NEURONTIN) 300 MG capsule Take 1 capsule (300 mg total) by mouth 2 (two) times daily AND 2 capsules (600 mg total) at bedtime.   ipratropium (ATROVENT) 0.06 % nasal spray Place 2 sprays into both nostrils 4 (four) times daily.   levothyroxine (SYNTHROID) 150 MCG tablet Take 1 tablet (150 mcg total) by mouth daily before breakfast.   NOVOLOG FLEXPEN 100 UNIT/ML FlexPen Inject 15 Units into the skin 3 (three) times daily with meals.   tirzepatide [facility]) 5 MG/0.5ML Pen Inject 5 mg into the skin once a week.   tirzepatide (MOUNJARO) 7.5 MG/0.5ML Pen Inject 7.5 mg into the skin once a week.   TOUJEO SOLOSTAR 300 UNIT/ML Solostar Pen Inject 42 Units into the skin daily.   No current facility-administered medications for this visit. (Other)      REVIEW OF SYSTEMS: ROS   Negative for:  Constitutional, Gastrointestinal, Neurological, Skin, Genitourinary, Musculoskeletal, HENT, Endocrine, Cardiovascular, Eyes, Respiratory, Psychiatric, Allergic/Imm, Heme/Lymph Last edited by Angeline Slim on 05/18/2021  8:40 AM.       ALLERGIES Allergies  Allergen Reactions   Other Other (See Comments)    States ink from newspaper and coupons causes sneezing, watery eyes   Piper Other (See Comments)    States black pepper causes sneezing, watery eyes   Septra Ds [Sulfamethoxazole-Trimethoprim]     Heart racing, nausea, and headache   Shellfish-Derived Products Other (See Comments)    Sneezing, watery eyes, runny nose    PAST MEDICAL HISTORY Past Medical History:  Diagnosis Date   Depression    Diabetes mellitus without complication (HCC)    Thyroid disease    Past Surgical History:  Procedure Laterality Date   CESAREAN SECTION     FOOT SURGERY Left     FAMILY HISTORY Family History  Problem Relation Age of Onset   Diabetes Mother    Alzheimer's disease Mother    Lung cancer Mother    Liver cancer Son     SOCIAL HISTORY Social History   Tobacco Use   Smoking status: Former    Packs/day: 1.00    Years: 30.00    Pack years: 30.00    Types: Cigarettes    Quit date: 2012    Years since quitting: 11.2   Smokeless tobacco: Never  Tobacco comments:    quit 2012  Vaping Use   Vaping Use: Never used  Substance Use Topics   Alcohol use: Yes    Comment: Drinks a couple ounces of rum most days of the week   Drug use: Never         OPHTHALMIC EXAM:  Base Eye Exam     Visual Acuity (ETDRS)       Right Left   Dist [place] 20/30 -1 20/40   Dist ph  NI NI         Tonometry (Tonopen, 8:48 AM)       Right Left   Pressure 19 17         Pupils       Pupils APD   Right PERRL None   Left PERRL None         Visual Fields       Left Right    Full Full         Extraocular Movement       Right Left    Full Full         Neuro/Psych      Oriented x3: Yes   Mood/Affect: Normal         Dilation     Left eye: 1.0% Mydriacyl, 2.5% Phenylephrine @ 8:48 AM           Slit Lamp and Fundus Exam     External Exam       Right Left   External Normal Normal         Slit Lamp Exam       Right Left   Lids/Lashes Normal Normal   Conjunctiva/Sclera White and quiet White and quiet   Cornea Clear Clear   Anterior Chamber Deep and quiet Deep and quiet   Iris Round and reactive Round and reactive   Lens 1+ Nuclear sclerosis 1+ Nuclear sclerosis   Anterior Vitreous Normal Normal         Fundus Exam       Right Left   C/D Ratio  0.25   Macula  Mild clinically significant macular edema, Microaneurysms   Vessels  NPDR-Severe findings in all quadrants, with NVE nasal superonasal PDR in 1 quadrant   Periphery  Dot-blot hemorrhage, equator and anteriorly            IMAGING AND PROCEDURES  Imaging and Procedures for 05/18/21  OCT, Retina - OU - Both Eyes       Right Eye Quality was good. Scan locations included subfoveal. Central Foveal Thickness: 371. Progression has improved. Findings include abnormal foveal contour, vitreomacular adhesion .   Left Eye Quality was good. Scan locations included subfoveal. Central Foveal Thickness: 300. Progression has improved. Findings include vitreomacular adhesion .   Notes Bilateral centers involved CSME OD much improved with some small residual CME and OS vastly improved over the last 1 month post injection       Panretinal Photocoagulation - OS - Left Eye       Time Out Confirmed correct patient, procedure, site, and patient consented.   Anesthesia Topical anesthesia was used. Anesthetic medications included Proparacaine 0.5%.   Laser Information The type of laser was diode. Color was yellow. The duration in seconds was 0.04. The spot size was 390 microns. Laser power was 280. Total spots was 584.   Post-op The patient tolerated the procedure well. There  were no complications. The patient received written and verbal post procedure care education.  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 Proliferative diabetic retinopathy of left eye with macular edema associated with type 2 diabetes mellitus (HCC)  E11.3512 OCT, Retina - OU - Both Eyes    Panretinal Photocoagulation - OS - Left Eye    CANCELED: Pneumatic Retinopexy - OS - Left Eye    2. 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  OS vastly improved center involved CSME post injection Avastin, for PRP #1 for local PDR, in region of nonperfusion of the retina nasally.  Completed today follow-up for CSME in the coming weeks  2.  The vastly improved post PRP #1 and Avastin No. 1, follow-up in 3 weeks likely injection right eye for residual CSME center involved  3.  Ophthalmic Meds Ordered this visit:  No orders of the defined types were placed in this encounter.      Return in about 3 weeks (around 06/08/2021) for DILATE OU, OCT, possible injection OD, AVASTIN OCT.  There are no Patient Instructions on file for this visit.   Explained the diagnoses, plan, and follow up with the patient and they expressed understanding.  Patient expressed understanding of the importance of proper follow up care.   Alford Highland Ailin Rochford  M.D. Diseases & Surgery of the Retina and Vitreous [facility] 05/18/21     Abbreviations: M myopia (nearsighted); A astigmatism; H hyperopia (farsighted); P presbyopia; Mrx spectacle prescription;  CTL contact lenses; OD right eye; OS left eye; OU both eyes  XT exotropia; ET esotropia; PEK punctate epithelial keratitis; PEE punctate epithelial erosions; DES dry eye syndrome; MGD meibomian gland dysfunction; ATs artificial tears; PFAT's preservative free artificial tears; NSC nuclear sclerotic cataract; PSC posterior subcapsular cataract; ERM epi-retinal membrane; PVD posterior vitreous detachment; RD retinal detachment; DM diabetes mellitus; DR diabetic retinopathy; NPDR non-proliferative diabetic retinopathy; PDR proliferative diabetic retinopathy; CSME clinically significant macular edema; DME diabetic macular edema; dbh dot blot hemorrhages; CWS cotton wool spot; POAG primary open angle glaucoma; C/D cup-to-disc ratio; HVF humphrey visual field; GVF goldmann visual field; OCT optical coherence tomography; IOP intraocular pressure; BRVO Branch retinal vein occlusion; CRVO central retinal vein occlusion; CRAO central retinal artery occlusion; BRAO branch retinal artery occlusion; RT retinal tear; SB scleral buckle; PPV pars plana vitrectomy; VH Vitreous hemorrhage; PRP panretinal laser photocoagulation; IVK intravitreal kenalog; VMT vitreomacular traction; MH Macular hole;  NVD neovascularization of the disc; NVE neovascularization elsewhere; AREDS age related eye disease study; ARMD age related macular degeneration; POAG primary open angle glaucoma; EBMD epithelial/anterior basement membrane dystrophy; ACIOL anterior chamber intraocular lens; IOL intraocular lens; PCIOL posterior chamber intraocular lens; Phaco/IOL phacoemulsification with intraocular lens placement; PRK photorefractive keratectomy; LASIK laser assisted in situ keratomileusis; HTN hypertension; DM diabetes mellitus; COPD  chronic obstructive pulmonary disease

## 2021-05-19 ENCOUNTER — Ambulatory Visit (INDEPENDENT_AMBULATORY_CARE_PROVIDER_SITE_OTHER): Payer: 59 | Admitting: Internal Medicine

## 2021-05-19 DIAGNOSIS — E11311 Type 2 diabetes mellitus with unspecified diabetic retinopathy with macular edema: Secondary | ICD-10-CM

## 2021-05-19 DIAGNOSIS — Z794 Long term (current) use of insulin: Secondary | ICD-10-CM | POA: Diagnosis not present

## 2021-05-19 DIAGNOSIS — E113313 Type 2 diabetes mellitus with moderate nonproliferative diabetic retinopathy with macular edema, bilateral: Secondary | ICD-10-CM | POA: Diagnosis not present

## 2021-05-19 MED ORDER — TIRZEPATIDE 10 MG/0.5ML ~~LOC~~ SOAJ: 10.0000 mg | SUBCUTANEOUS | 2 refills | Status: DC

## 2021-05-19 NOTE — Progress Notes (Signed)
?  [facility] Telephone Encounter ?Continuity Care Appointment ? ?HPI:  ?This telephone encounter was created for Ms. Kathy Ramirez on 05/19/2021 for the following purpose/cc medication management.  ? ?Past Medical History:  ?Past Medical History:  ?Diagnosis Date  ? Depression   ? Diabetes mellitus without complication ([place])   ? Thyroid disease   ?  ? ?ROS:  ?See HPI  ? ?Assessment / Plan / Recommendations:  ?Please see A&P under problem oriented charting for assessment of the patient's acute and chronic medical conditions.  ?As always, pt is advised that if symptoms worsen or new symptoms arise, they should go to an urgent care facility or to to ER for further evaluation.  ? ?Consent and Medical Decision Making:  ?Patient discussed with Dr. Philipp Ovens ?This is a telephone encounter between Kathy Ramirez and [employer] on 05/19/2021 for medication. The visit was conducted with the patient located at home and Delene Ruffini at [facility]. The patient's identity was confirmed using their DOB and current address. The patient has consented to being evaluated through a telephone encounter and understands the associated risks (an examination cannot be done and the patient may need to come in for an appointment) / benefits (allows the patient to remain at home, decreasing exposure to coronavirus). I personally spent 15 minutes on medical discussion.   ? ? ?

## 2021-05-20 ENCOUNTER — Encounter: Payer: Self-pay | Admitting: Internal Medicine

## 2021-05-20 DIAGNOSIS — E119 Type 2 diabetes mellitus without complications: Secondary | ICD-10-CM | POA: Insufficient documentation

## 2021-05-20 DIAGNOSIS — E118 Type 2 diabetes mellitus with unspecified complications: Secondary | ICD-10-CM | POA: Insufficient documentation

## 2021-05-20 NOTE — Assessment & Plan Note (Addendum)
Patient has been using tirzepatide to assist in glucose control. Currently taking 7.5mg  dose and wanting to increase to 10mg  to further assist with weight. States she is doing well with medication. Reports mild gas, but no N/V/D.  ?Most recent A1c well controlled. States that her blood sugar has been much better controlled since initiating medication.  ?We will increase to 10mg . She has not had an a1c for several months. Instructed patient that we will need to see her in office in next 2-4 weeks for repeat A1c.  ? ?Addendum: ?[facility] does not have any Mounjaro in stock. Patient requests the 7.5 mg dose be sent today to [facility] at [facility] as she is due for injection today. She is still willing to increase to 10 mg when dose becomes available. ?Will send 7.5. ?

## 2021-05-20 NOTE — Patient Instructions (Signed)
Dear Kathy Ramirez, ? ?We will increase your Tirzepatide to 10mg . Please schedule an appointment to be seen in the clinic for a repeat A1c.  ?

## 2021-05-25 ENCOUNTER — Telehealth: Payer: Self-pay | Admitting: *Deleted

## 2021-05-25 MED ORDER — MOUNJARO 7.5 MG/0.5ML ~~LOC~~ SOAJ: 7.5000 mg | SUBCUTANEOUS | 0 refills | Status: DC

## 2021-05-25 NOTE — Telephone Encounter (Signed)
Patient called in stating [facility] does not have Mounjaro in stock. She will call [facility] to see if they have it in stock. ?

## 2021-05-25 NOTE — Telephone Encounter (Signed)
[facility] does not have any Mounjaro in stock. Patient requests the 7.5 mg dose be sent today to [facility] at [facility] as she is due for injection today. She is still willing to increase to 10 mg when dose becomes available. ?

## 2021-05-25 NOTE — Addendum Note (Signed)
Addended by: Delene Ruffini T on: 05/25/2021 02:16 PM ? ? Modules accepted: Orders ? ?

## 2021-05-25 NOTE — Telephone Encounter (Signed)
Patient notified refill on 7.5 mg has been sent to [facility]. ?

## 2021-06-01 NOTE — Progress Notes (Signed)
Internal Medicine Clinic Attending ° °Case discussed with Dr. Gawaluck  At the time of the visit.  We reviewed the resident’s history and exam and pertinent patient test results.  I agree with the assessment, diagnosis, and plan of care documented in the resident’s note.  °

## 2021-06-08 ENCOUNTER — Other Ambulatory Visit: Payer: Self-pay

## 2021-06-08 ENCOUNTER — Ambulatory Visit (INDEPENDENT_AMBULATORY_CARE_PROVIDER_SITE_OTHER): Payer: 59 | Admitting: Ophthalmology

## 2021-06-08 ENCOUNTER — Encounter (INDEPENDENT_AMBULATORY_CARE_PROVIDER_SITE_OTHER): Payer: Self-pay | Admitting: Ophthalmology

## 2021-06-08 DIAGNOSIS — E113511 Type 2 diabetes mellitus with proliferative diabetic retinopathy with macular edema, right eye: Secondary | ICD-10-CM | POA: Diagnosis not present

## 2021-06-08 DIAGNOSIS — E113512 Type 2 diabetes mellitus with proliferative diabetic retinopathy with macular edema, left eye: Secondary | ICD-10-CM | POA: Diagnosis not present

## 2021-06-08 DIAGNOSIS — H43823 Vitreomacular adhesion, bilateral: Secondary | ICD-10-CM | POA: Diagnosis not present

## 2021-06-08 MED ORDER — BEVACIZUMAB 2.5 MG/0.1ML IZ SOSY
2.5000 mg | PREFILLED_SYRINGE | INTRAVITREAL | Status: AC | PRN
  Administered 2021-06-08: 2.5 mg via INTRAVITREAL

## 2021-06-08 NOTE — Assessment & Plan Note (Signed)
OS improved and stable no active CSME today ?

## 2021-06-08 NOTE — Assessment & Plan Note (Signed)
Vastly improved OD post injection and post PRP #1 OD.  Will repeat injection today to maintain a likely need completion of PRP OD in the future ?

## 2021-06-08 NOTE — Progress Notes (Signed)
? ? ?06/08/2021 ? ?  ? ?CHIEF COMPLAINT ?Patient presents for  ?Chief Complaint  ?Patient presents with  ? Diabetic Retinopathy with Macular Edema  ? ? ? ? ?HISTORY OF PRESENT ILLNESS: ?Kathy Ramirez is a 61 y.o. female who presents to the clinic today for:  ? ?HPI   ?3 weeks for DILATE OU, OCT. Possible injection OD, AVASTIN. ? ?Pt states vision is good in the morning but gradually worsens throughout the day. Pt stated vision is still blurry. ?Pt denies FOL and sees floaters every once in awhile. ?OD today at some 6 weeks post most recent injection as well as 4 weeks post PRP #1 OD ? ?OS some 3 weeks post PRP #1  ? ?Last edited by Edmon Crape, MD on 06/08/2021  9:10 AM.  ?  ? ? ?Referring physician: ?Rehman, Areeg N, DO ?[address] ?[place],  [place] [address] ? ?HISTORICAL INFORMATION:  ? ?Selected notes from the MEDICAL RECORD NUMBER ?  ? ?Lab Results  ?Component Value Date  ? HGBA1C 6.9 (A) 01/15/2021  ?  ? ?CURRENT MEDICATIONS: ?No current outpatient medications on file. (Ophthalmic Drugs)  ? ?No current facility-administered medications for this visit. (Ophthalmic Drugs)  ? ?Current Outpatient Medications (Other)  ?Medication Sig  ? DULoxetine (CYMBALTA) 60 MG capsule Take 1 capsule (60 mg total) by mouth daily.  ? empagliflozin (JARDIANCE) 10 MG TABS tablet Take 1 tablet (10 mg total) by mouth daily before breakfast.  ? gabapentin (NEURONTIN) 300 MG capsule Take 1 capsule (300 mg total) by mouth 2 (two) times daily AND 2 capsules (600 mg total) at bedtime.  ? ipratropium (ATROVENT) 0.06 % nasal spray Place 2 sprays into both nostrils 4 (four) times daily.  ? levothyroxine (SYNTHROID) 150 MCG tablet Take 1 tablet (150 mcg total) by mouth daily before breakfast.  ? NOVOLOG FLEXPEN 100 UNIT/ML FlexPen Inject 15 Units into the skin 3 (three) times daily with meals.  ? tirzepatide (MOUNJARO) 7.5 MG/0.5ML Pen Inject 7.5 mg into the skin once a week.  ? TOUJEO SOLOSTAR 300 UNIT/ML Solostar Pen Inject 42 Units into  the skin daily.  ? ?No current facility-administered medications for this visit. (Other)  ? ? ? ? ?REVIEW OF SYSTEMS: ?ROS   ?Negative for: Constitutional, Gastrointestinal, Neurological, Skin, Genitourinary, Musculoskeletal, HENT, Endocrine, Cardiovascular, Eyes, Respiratory, Psychiatric, Allergic/Imm, Heme/Lymph ?Last edited by Angeline Slim on 06/08/2021  8:21 AM.  ?  ? ? ? ?ALLERGIES ?Allergies  ?Allergen Reactions  ? Other Other (See Comments)  ?  States ink from newspaper and coupons causes sneezing, watery eyes  ? Piper Other (See Comments)  ?  States black pepper causes sneezing, watery eyes  ? Septra Ds [Sulfamethoxazole-Trimethoprim]   ?  Heart racing, nausea, and headache  ? Shellfish-Derived Products Other (See Comments)  ?  Sneezing, watery eyes, runny nose  ? ? ?PAST MEDICAL HISTORY ?Past Medical History:  ?Diagnosis Date  ? Depression   ? Diabetes mellitus without complication (HCC)   ? Thyroid disease   ? ?Past Surgical History:  ?Procedure Laterality Date  ? CESAREAN SECTION    ? FOOT SURGERY Left   ? ? ?FAMILY HISTORY ?Family History  ?Problem Relation Age of Onset  ? Diabetes Mother   ? Alzheimer's disease Mother   ? Lung cancer Mother   ? Liver cancer Son   ? ? ?SOCIAL HISTORY ?Social History  ? ?Tobacco Use  ? Smoking status: Former  ?  Packs/day: 1.00  ?  Years: 30.00  ?  Pack years: 30.00  ?  Types: Cigarettes  ?  Quit date: 2012  ?  Years since quitting: 11.2  ? Smokeless tobacco: Never  ? Tobacco comments:  ?  quit 2012  ?Vaping Use  ? Vaping Use: Never used  ?Substance Use Topics  ? Alcohol use: Yes  ?  Comment: Drinks a couple ounces of rum most days of the week  ? Drug use: Never  ? ?  ? ?  ? ?OPHTHALMIC EXAM: ? ?Base Eye Exam   ? ? Visual Acuity (ETDRS)   ? ?   Right Left  ? Dist [place] 20/40 -1 20/40 -1  ? Dist ph [place] 20/30 -1  ? ?  ?  ? ? Tonometry (Tonopen, 8:31 AM)   ? ?   Right Left  ? Pressure 21 22  ? ?  ?  ? ? Pupils   ? ?   Pupils APD  ? Right PERRL None  ? Left PERRL None  ? ?  ?  ? ?  Visual Fields   ? ?   Left Right  ?  Full Full  ? ?  ?  ? ? Extraocular Movement   ? ?   Right Left  ?  Full Full  ? ?  ?  ? ? Neuro/Psych   ? ? Oriented x3: Yes  ? Mood/Affect: Normal  ? ?  ?  ? ? Dilation   ? ? Both eyes: 1.0% Mydriacyl, 2.5% Phenylephrine @ 8:30 AM  ? ?  ?  ? ?  ? ?Slit Lamp and Fundus Exam   ? ? External Exam   ? ?   Right Left  ? External Normal Normal  ? ?  ?  ? ? Slit Lamp Exam   ? ?   Right Left  ? Lids/Lashes Normal Normal  ? Conjunctiva/Sclera White and quiet White and quiet  ? Cornea Clear Clear  ? Anterior Chamber Deep and quiet Deep and quiet  ? Iris Round and reactive Round and reactive  ? Lens 2+ Nuclear sclerosis 1+ Nuclear sclerosis  ? Anterior Vitreous Normal Normal  ? ?  ?  ? ? Fundus Exam   ? ?   Right Left  ? Posterior Vitreous Normal   ? Disc Normal   ? C/D Ratio 0.25   ? Macula Mild clinically significant macular edema, Microaneurysms   ? 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 Periphery Dot-blot hemorrhage, equator and anteriorly   ? ?  ?  ? ?  ? ? ?IMAGING AND PROCEDURES  ?Imaging and Procedures for 06/08/21 ? ?OCT, Retina - OU - Both Eyes   ? ?   ?Right Eye ?Quality was good. Scan locations included subfoveal. Central Foveal Thickness: 335. Progression has improved. Findings include abnormal foveal contour, vitreomacular adhesion .  ? ?Left Eye ?Quality was good. Scan locations included subfoveal. Central Foveal Thickness: 311. Progression has improved. Findings include vitreomacular adhesion .  ? ?Notes ?Bilateral centers involved CSME OD much improved with some small residual CME and OS vastly improved over the last 1 month post injection, and improved CSME OD ? ? ? ?  ? ?Intravitreal Injection, Pharmacologic Agent - OD - Right Eye   ? ?   ?Time Out ?06/08/2021. 9:05 AM. Confirmed correct patient, procedure, site, and patient consented.  ? ?Anesthesia ?Topical anesthesia was used. Anesthetic medications included  Lidocaine 4%.  ? ?Procedure ?Preparation included 5%  betadine to ocular surface, 10% betadine to eyelids. A 30 gauge needle was used.  ? ?Injection: ?2.5 mg bevacizumab 2.5 MG/0.1ML ?  Route: Intravitreal, Site: Right Eye ?  NDC: 16109-604-5471449-091-43, Lot: [id number]  ? ?Post-op ?Post injection exam found visual acuity of at least counting fingers. The patient tolerated the procedure well. There were no complications. The patient received written and verbal post procedure care education. Post injection medications included ocuflox.  ? ?  ? ? ?  ?  ? ?  ?ASSESSMENT/PLAN: ? ?Vitreomacular adhesion of both eyes ?Moderate VMA OU, no impact on acuity ? ?Diabetic macular edema of right eye with proliferative retinopathy associated with type 2 diabetes mellitus (HCC) ?Vastly improved OD post injection and post PRP #1 OD.  Will repeat injection today to maintain a likely need completion of PRP OD in the future ? ?Proliferative diabetic retinopathy of left eye with macular edema associated with type 2 diabetes mellitus (HCC) ?OS improved and stable no active CSME today  ? ?  ICD-10-CM   ?1. 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  Intravitreal Injection, Pharmacologic Agent - OD - Right Eye  ?  bevacizumab (AVASTIN) SOSY 2.5 mg  ?  ?2. Vitreomacular adhesion of both eyes  H43.823   ?  ?3. Proliferative diabetic retinopathy of left eye with macular edema associated with type 2 diabetes mellitus (HCC)  Y78.2956E11.3512   ?  ? ? ?1.  OD with vastly improved CSME post injection fall so following PRP #1.  Will need completion of PRP peripherally OD to decrease vegF load ? ?2.  OS no active CSME, post PRP #1 we will need completion of PRP left eye in the future as well ? ?3. ? ?Ophthalmic Meds Ordered this visit:  ?Meds ordered this encounter  ?Medications  ? bevacizumab (AVASTIN) SOSY 2.5 mg  ? ? ?  ? ?Return in about 5 weeks (around  07/13/2021) for dilate, OD, AVASTIN OCT. ? ?There are no Patient Instructions on file for this visit. ? ? ?Explained the diagnoses, plan, and follow up with the patient and they expressed understanding.  Patie

## 2021-06-08 NOTE — Assessment & Plan Note (Signed)
Moderate VMA OU, no impact on acuity ?

## 2021-06-21 ENCOUNTER — Other Ambulatory Visit: Payer: Self-pay | Admitting: Student

## 2021-06-21 DIAGNOSIS — E113313 Type 2 diabetes mellitus with moderate nonproliferative diabetic retinopathy with macular edema, bilateral: Secondary | ICD-10-CM

## 2021-06-29 ENCOUNTER — Other Ambulatory Visit: Payer: Self-pay

## 2021-06-29 DIAGNOSIS — E113313 Type 2 diabetes mellitus with moderate nonproliferative diabetic retinopathy with macular edema, bilateral: Secondary | ICD-10-CM

## 2021-06-29 MED ORDER — EMPAGLIFLOZIN 10 MG PO TABS: 10.0000 mg | ORAL_TABLET | Freq: Every day | ORAL | 2 refills | Status: DC

## 2021-07-13 ENCOUNTER — Ambulatory Visit (INDEPENDENT_AMBULATORY_CARE_PROVIDER_SITE_OTHER): Payer: 59 | Admitting: Ophthalmology

## 2021-07-13 ENCOUNTER — Encounter (INDEPENDENT_AMBULATORY_CARE_PROVIDER_SITE_OTHER): Payer: Self-pay | Admitting: Ophthalmology

## 2021-07-13 DIAGNOSIS — E113512 Type 2 diabetes mellitus with proliferative diabetic retinopathy with macular edema, left eye: Secondary | ICD-10-CM | POA: Diagnosis not present

## 2021-07-13 DIAGNOSIS — E113511 Type 2 diabetes mellitus with proliferative diabetic retinopathy with macular edema, right eye: Secondary | ICD-10-CM

## 2021-07-13 DIAGNOSIS — H2513 Age-related nuclear cataract, bilateral: Secondary | ICD-10-CM

## 2021-07-13 MED ORDER — BEVACIZUMAB 2.5 MG/0.1ML IZ SOSY
2.5000 mg | PREFILLED_SYRINGE | INTRAVITREAL | Status: AC | PRN
  Administered 2021-07-13: 2.5 mg via INTRAVITREAL

## 2021-07-13 NOTE — Assessment & Plan Note (Signed)
Center involved CSME improving.  Repeat injection today at current interval follow-up in the right eye of 5 weeks and reevaluate in 5 to 6 weeks right eye ?

## 2021-07-13 NOTE — Progress Notes (Addendum)
? ? ?07/13/2021 ? ?  ? ?CHIEF COMPLAINT ?Patient presents for  ?Chief Complaint  ?Patient presents with  ? Diabetic Retinopathy with Macular Edema  ? ? ? ? ?HISTORY OF PRESENT ILLNESS: ?Kathy Ramirez is a 61 y.o. female who presents to the clinic today for:  ? ?HPI   ?5 weeks for DILATE, OD, AVASTIN OCT. ?Pt stated vision has been stable. ?Pt denies floaters and FOL. ? ?Last edited by Angeline SlimMa, San on 07/13/2021  9:27 AM.  ?  ? ? ?Referring physician: ?Sallye LatGroat, Christopher, MD ?[address] ?[address] ?[place],  [place] [id number] ? ?HISTORICAL INFORMATION:  ? ?Selected notes from the MEDICAL RECORD NUMBER ?  ? ?Lab Results  ?Component Value Date  ? HGBA1C 6.9 (A) 01/15/2021  ?  ? ?CURRENT MEDICATIONS: ?No current outpatient medications on file. (Ophthalmic Drugs)  ? ?No current facility-administered medications for this visit. (Ophthalmic Drugs)  ? ?Current Outpatient Medications (Other)  ?Medication Sig  ? DULoxetine (CYMBALTA) 60 MG capsule Take 1 capsule (60 mg total) by mouth daily.  ? empagliflozin (JARDIANCE) 10 MG TABS tablet Take 1 tablet (10 mg total) by mouth daily before breakfast.  ? gabapentin (NEURONTIN) 300 MG capsule Take 1 capsule (300 mg total) by mouth 2 (two) times daily AND 2 capsules (600 mg total) at bedtime.  ? ipratropium (ATROVENT) 0.06 % nasal spray Place 2 sprays into both nostrils 4 (four) times daily.  ? JARDIANCE 10 MG TABS tablet TAKE 1 TABLET BY MOUTH ONCE DAILY BEFORE BREAKFAST  ? levothyroxine (SYNTHROID) 150 MCG tablet Take 1 tablet (150 mcg total) by mouth daily before breakfast.  ? NOVOLOG FLEXPEN 100 UNIT/ML FlexPen Inject 15 Units into the skin 3 (three) times daily with meals.  ? tirzepatide (MOUNJARO) 7.5 MG/0.5ML Pen Inject 7.5 mg into the skin once a week.  ? TOUJEO SOLOSTAR 300 UNIT/ML Solostar Pen Inject 42 Units into the skin daily.  ? ?No current facility-administered medications for this visit. (Other)  ? ? ? ? ?REVIEW OF SYSTEMS: ?ROS   ?Negative for: Constitutional, Gastrointestinal,  Neurological, Skin, Genitourinary, Musculoskeletal, HENT, Endocrine, Cardiovascular, Eyes, Respiratory, Psychiatric, Allergic/Imm, Heme/Lymph ?Last edited by Angeline SlimMa, San on 07/13/2021  9:27 AM.  ?  ? ? ? ?ALLERGIES ?Allergies  ?Allergen Reactions  ? Other Other (See Comments)  ?  States ink from newspaper and coupons causes sneezing, watery eyes  ? Piper Other (See Comments)  ?  States black pepper causes sneezing, watery eyes  ? Septra Ds [Sulfamethoxazole-Trimethoprim]   ?  Heart racing, nausea, and headache  ? Shellfish-Derived Products Other (See Comments)  ?  Sneezing, watery eyes, runny nose  ? ? ?PAST MEDICAL HISTORY ?Past Medical History:  ?Diagnosis Date  ? Depression   ? Diabetes mellitus without complication (HCC)   ? Thyroid disease   ? ?Past Surgical History:  ?Procedure Laterality Date  ? CESAREAN SECTION    ? FOOT SURGERY Left   ? ? ?FAMILY HISTORY ?Family History  ?Problem Relation Age of Onset  ? Diabetes Mother   ? Alzheimer's disease Mother   ? Lung cancer Mother   ? Liver cancer Son   ? ? ?SOCIAL HISTORY ?Social History  ? ?Tobacco Use  ? Smoking status: Former  ?  Packs/day: 1.00  ?  Years: 30.00  ?  Pack years: 30.00  ?  Types: Cigarettes  ?  Quit date: 2012  ?  Years since quitting: 11.3  ? Smokeless tobacco: Never  ? Tobacco comments:  ?  quit 2012  ?Vaping Use  ? Vaping Use: Never used  ?Substance Use Topics  ? Alcohol use: Yes  ?  Comment: Drinks a couple ounces of rum most days of the week  ? Drug use: Never  ? ?  ? ?  ? ?OPHTHALMIC EXAM: ? ?Base Eye Exam   ? ? Visual Acuity (ETDRS)   ? ?   Right Left  ? Dist [place] 20/30 -2 20/100 -1  ? Dist ph [place]  20/60  ? ?  ?  ? ? Tonometry (Tonopen, 9:34 AM)   ? ?   Right Left  ? Pressure 14 12  ? ?  ?  ? ? Pupils   ? ?   Pupils APD  ? Right PERRL None  ? Left PERRL None  ? ?  ?  ? ? Visual Fields   ? ?   Left Right  ?  Full Full  ? ?  ?  ? ? Extraocular Movement   ? ?   Right Left  ?  Full Full  ? ?  ?  ? ? Neuro/Psych   ? ? Oriented x3: Yes  ? Mood/Affect:  Normal  ? ?  ?  ? ? Dilation   ? ? Right eye: 2.5% Phenylephrine, 1.0% Mydriacyl @ 9:34 AM  ? ?  ?  ? ?  ? ?Slit Lamp and Fundus Exam   ? ? External Exam   ? ?   Right Left  ? External Normal Normal  ? ?  ?  ? ? Slit Lamp Exam   ? ?   Right Left  ? Lids/Lashes Normal Normal  ? Conjunctiva/Sclera White and quiet White and quiet  ? Cornea Clear Clear  ? Anterior Chamber Deep and quiet Deep and quiet  ? Iris Round and reactive Round and reactive  ? Lens 2+ Nuclear sclerosis 1+ Nuclear sclerosis  ? Anterior Vitreous Normal Normal  ? ?  ?  ? ? Fundus Exam   ? ?   Right Left  ? Posterior Vitreous Normal   ? Disc Normal   ? C/D Ratio 0.25   ? Macula Mild clinically significant macular edema, Microaneurysms   ? 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 Periphery Dot-blot hemorrhage, equator and anteriorly   ? ?  ?  ? ?  ? ? ?IMAGING AND PROCEDURES  ?Imaging and Procedures for 07/14/21 ? ?OCT, Retina - OU - Both Eyes   ? ?   ?Right Eye ?Quality was good. Scan locations included subfoveal. Central Foveal Thickness: 366. Progression has improved. Findings include abnormal foveal contour, vitreomacular adhesion .  ? ?Left Eye ?Quality was good. Scan locations included subfoveal. Central Foveal Thickness: 402. Progression has improved. Findings include vitreomacular adhesion .  ? ?Notes ?Bilateral centers involved CSME OD much improved with some small residual CME, will repeat injection OD today.  And  OS now worse at 8 weeks post most recent injection OS.  We will need to repeat evaluation soon and injection OS ? ?  ? ?Intravitreal Injection, Pharmacologic Agent - OD - Right Eye   ? ?   ?Time Out ?07/13/2021. 10:14 AM. Confirmed correct patient, procedure, site, and patient consented.  ? ?Anesthesia ?Topical anesthesia was used. Anesthetic medications included Lidocaine 4%.  ? ?Procedure ?Preparation included 5% betadine to ocular surface, 10% betadine to eyelids. A 30 gauge  needle was used.  ? ?Injection: ?2.5 mg bevacizumab 2.5  MG/0.1ML ?  Route: Intravitreal, Site: Right Eye ?  NDC: 42683-419-62, Lot: [id number]  ? ?Post-op ?Post injection exam found visual acuity of at least counting fingers. The patient tolerated the procedure well. There were no complications. The patient received written and verbal post procedure care education. Post injection medications included ocuflox.  ? ?  ? ? ?  ?  ? ?  ?ASSESSMENT/PLAN: ? ?Proliferative diabetic retinopathy of left eye with macular edema associated with type 2 diabetes mellitus (HCC) ?Recurrence of CSME OS, center involvement.  Currently at follow-up interval of 8 weeks post most recent therapy.  That is proving that we do need to continue with therapy left eye to prevent progression of vision loss or CSME ? ?Proliferative diabetic retinopathy, right eye (HCC) ?Center involved CSME improving.  Repeat injection today at current interval follow-up in the right eye of 5 weeks and reevaluate in 5 to 6 weeks right eye ? ?Nuclear sclerotic cataract of both eyes ?The nature of cataract was discussed with the patient as well as the elective nature of surgery. The patient was reassured that surgery at a later date does not put the patient at risk for a worse outcome. It was emphasized that the need for surgery is dictated by the patient's quality of life as influenced by the cataract. Patient was instructed to maintain close follow up with their general eye care doctor.  ? ?  ICD-10-CM   ?1. 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  Intravitreal Injection, Pharmacologic Agent - OD - Right Eye  ?  bevacizumab (AVASTIN) SOSY 2.5 mg  ?  ?2. Proliferative diabetic retinopathy of left eye with macular edema associated with type 2 diabetes mellitus (HCC)  Q11.9417   ?  ?3. Proliferative diabetic retinopathy of right eye with macular edema  associated with type 2 diabetes mellitus (HCC)  E08.1448   ?  ?4. Nuclear sclerotic cataract of both eyes  H25.13   ?  ? ? ?1.  OU with PDR and active CSME persistent.  Will need to control today with inj

## 2021-07-13 NOTE — Assessment & Plan Note (Signed)

## 2021-07-13 NOTE — Assessment & Plan Note (Signed)
Recurrence of CSME OS, center involvement.  Currently at follow-up interval of 8 weeks post most recent therapy.  That is proving that we do need to continue with therapy left eye to prevent progression of vision loss or CSME ?

## 2021-07-22 ENCOUNTER — Encounter (INDEPENDENT_AMBULATORY_CARE_PROVIDER_SITE_OTHER): Payer: 59 | Admitting: Ophthalmology

## 2021-08-17 ENCOUNTER — Other Ambulatory Visit: Payer: Self-pay | Admitting: Student

## 2021-08-17 DIAGNOSIS — E113313 Type 2 diabetes mellitus with moderate nonproliferative diabetic retinopathy with macular edema, bilateral: Secondary | ICD-10-CM

## 2021-08-25 NOTE — Telephone Encounter (Signed)
Will refill insulin, please call and schedule patient for follow up appointment for her diabetes. Thank you!

## 2021-08-26 ENCOUNTER — Telehealth: Payer: Self-pay

## 2021-08-26 NOTE — Telephone Encounter (Signed)
TOUJEO SOLOSTAR 300 UNIT/ML Solostar Pen  tirzepatide (MOUNJARO) 7.5 MG/0.5ML Pen, want the dosage increase.   NOVOLOG FLEXPEN 100 UNIT/ML FlexPen, refill request @ [facility] - [place] ([place]), [place] - [address]

## 2021-08-27 ENCOUNTER — Other Ambulatory Visit: Payer: Self-pay | Admitting: Internal Medicine

## 2021-08-28 ENCOUNTER — Other Ambulatory Visit (HOSPITAL_COMMUNITY): Payer: Self-pay

## 2021-08-28 MED ORDER — MOUNJARO 10 MG/0.5ML ~~LOC~~ SOAJ
10.0000 mg | SUBCUTANEOUS | 3 refills | Status: DC
Start: 2021-08-28 — End: 2021-09-23
  Filled 2021-08-28 – 2021-09-07 (×4): qty 2, 28d supply, fill #0

## 2021-08-28 NOTE — Telephone Encounter (Addendum)
She needs a repeat A1c prior to up-titrating. From the last documentation she was to return in 2-4 weeks for repeat A1c. I would prefer to wait until then. Thanks

## 2021-08-28 NOTE — Telephone Encounter (Signed)
Patient called in stating she has been on Mounjaro 10 mg x 3 months and believes it is time to up titrate to 12.5 mg. She is requesting 3 month Rx for 12.5 mg.

## 2021-08-31 NOTE — Telephone Encounter (Signed)
Pt calling back about her medication refill.  Pt states she is aware she is overdue for an in person visit and has sch an appt for  Name: Kathy Ramirez, [place] MRN: [id number]  Date: 09/23/2021 Status: Sch  Time: 10:45 AM Length: 30  Visit Type: OPEN ESTABLISHED [726] Copay: $30.00  Provider: Rudene Christians, DO      tirzepatide [facility]) 7.5 MG/0.5ML Pen, want the dosage increase.   Pt state the [facility] has the medication she is requesting because [facility] does not have it as it is on back order.  Please call the patient back

## 2021-09-07 ENCOUNTER — Other Ambulatory Visit (HOSPITAL_COMMUNITY): Payer: Self-pay

## 2021-09-14 ENCOUNTER — Telehealth: Payer: Self-pay

## 2021-09-14 NOTE — Telephone Encounter (Signed)
It looks like it isn't on the pharmacy formulary, can we try another pharmacy?

## 2021-09-14 NOTE — Telephone Encounter (Signed)
Was she getting this medication after 03/27/21? Can she use the savings card outside of a prior auth?

## 2021-09-14 NOTE — Telephone Encounter (Signed)
Another  ( 3RD ) pa for pt [facility] ) came through on cover my meds yet again it has been denied /closed     Following reasons :  Outcome Additional Information Required The [employer] Prior Authorization Team is unable to review this request for prior authorization as there is a closed record on file with duplicate information, Case ID: [id number]. This case was closed on 03/27/2021 because the requested medication is not on the patient's formulary.    Same as before ... PT insurance is not covering please change med

## 2021-09-23 ENCOUNTER — Other Ambulatory Visit: Payer: Self-pay

## 2021-09-23 ENCOUNTER — Ambulatory Visit: Payer: 59 | Admitting: Internal Medicine

## 2021-09-23 ENCOUNTER — Other Ambulatory Visit (HOSPITAL_COMMUNITY): Payer: Self-pay

## 2021-09-23 VITALS — BP 135/73 | HR 73 | Temp 98.9°F | Ht 67.0 in | Wt 275.9 lb

## 2021-09-23 DIAGNOSIS — G8929 Other chronic pain: Secondary | ICD-10-CM | POA: Diagnosis not present

## 2021-09-23 DIAGNOSIS — F411 Generalized anxiety disorder: Secondary | ICD-10-CM

## 2021-09-23 DIAGNOSIS — M545 Low back pain, unspecified: Secondary | ICD-10-CM | POA: Diagnosis not present

## 2021-09-23 DIAGNOSIS — Z794 Long term (current) use of insulin: Secondary | ICD-10-CM

## 2021-09-23 DIAGNOSIS — E113313 Type 2 diabetes mellitus with moderate nonproliferative diabetic retinopathy with macular edema, bilateral: Secondary | ICD-10-CM

## 2021-09-23 DIAGNOSIS — E11311 Type 2 diabetes mellitus with unspecified diabetic retinopathy with macular edema: Secondary | ICD-10-CM

## 2021-09-23 DIAGNOSIS — F329 Major depressive disorder, single episode, unspecified: Secondary | ICD-10-CM

## 2021-09-23 LAB — POCT GLYCOSYLATED HEMOGLOBIN (HGB A1C): Hemoglobin A1C: 6.4 % — AB (ref 4.0–5.6)

## 2021-09-23 LAB — GLUCOSE, CAPILLARY: Glucose-Capillary: 178 mg/dL — ABNORMAL HIGH (ref 70–99)

## 2021-09-23 MED ORDER — NOVOLOG FLEXPEN 100 UNIT/ML ~~LOC~~ SOPN: 25.0000 [IU] | PEN_INJECTOR | Freq: Three times a day (TID) | SUBCUTANEOUS | 0 refills | Status: DC

## 2021-09-23 MED ORDER — DULOXETINE HCL 30 MG PO CPEP: 90.0000 mg | ORAL_CAPSULE | Freq: Every day | ORAL | 3 refills | Status: DC

## 2021-09-23 MED ORDER — GABAPENTIN 300 MG PO CAPS: ORAL_CAPSULE | ORAL | 2 refills | Status: DC

## 2021-09-23 MED ORDER — EMPAGLIFLOZIN 10 MG PO TABS: 10.0000 mg | ORAL_TABLET | Freq: Every day | ORAL | 3 refills | Status: DC

## 2021-09-23 MED ORDER — MOUNJARO 7.5 MG/0.5ML ~~LOC~~ SOAJ: 7.5000 mg | SUBCUTANEOUS | 2 refills | Status: DC

## 2021-09-23 MED ORDER — TOUJEO SOLOSTAR 300 UNIT/ML ~~LOC~~ SOPN: PEN_INJECTOR | SUBCUTANEOUS | 0 refills | Status: DC

## 2021-09-23 MED ORDER — MOUNJARO 7.5 MG/0.5ML ~~LOC~~ SOAJ
7.5000 mg | SUBCUTANEOUS | 2 refills | Status: DC
  Filled 2021-09-23 – 2021-09-30 (×3): qty 2, 28d supply, fill #0

## 2021-09-23 NOTE — Patient Instructions (Signed)
Thank you, Ms.Zeidy T Anthis for allowing [place] to provide your care today. Today we discussed:  Diabetes I have sent in the [facility] at 7.5 mg to [facility] outpatient pharmacy.  Please continue taking Toujeo and NovoLog as you have been.  Daily glucometer report reflects a checked 4 times in the last month.  I am unsure if its not taking correctly.  Please check your blood sugar in the morning before eating and each time you inject insulin with meals.  As we increased the 1 geode dosing of hoping that we can decrease the amount of insulin that you are requiring.  Anxiety I am increasing duloxetine dosing from 60 mg to 90 mg.  Please follow-up in 1 month for anxiety.  Chronic back pain Your back pain is from arthritis.  I am referring you to physical therapy as I think this would be very beneficial to you.  Please continue taking duloxetine and gabapentin as this is likely helping with your back pain as well.  I have ordered the following labs for you:  Lab Orders         Microalbumin / Creatinine Urine Ratio         Glucose, capillary         POC Hbg A1C      Referrals ordered today:   Referral Orders         Ambulatory referral to Physical Therapy       I have ordered the following medication/changed the following medications:   Stop the following medications: Medications Discontinued During This Encounter  Medication Reason   tirzepatide (MOUNJARO) 10 MG/0.5ML Pen    MOUNJARO 10 MG/0.5ML Pen    DULoxetine (CYMBALTA) 60 MG capsule Change in therapy   empagliflozin (JARDIANCE) 10 MG TABS tablet Reorder   gabapentin (NEURONTIN) 300 MG capsule Reorder   JARDIANCE 10 MG TABS tablet Reorder   NOVOLOG FLEXPEN 100 UNIT/ML FlexPen Reorder   TOUJEO SOLOSTAR 300 UNIT/ML Solostar Pen Reorder   tirzepatide (MOUNJARO) 7.5 MG/0.5ML Pen      Start the following medications: Meds ordered this encounter  Medications   empagliflozin (JARDIANCE) 10 MG TABS tablet    Sig: Take 1 tablet (10 mg  total) by mouth daily before breakfast.    Dispense:  90 tablet    Refill:  3   NOVOLOG FLEXPEN 100 UNIT/ML FlexPen    Sig: Inject 25 Units into the skin 3 (three) times daily with meals.    Dispense:  15 mL    Refill:  0   DISCONTD: tirzepatide (MOUNJARO) 7.5 MG/0.5ML Pen    Sig: Inject 7.5 mg into the skin once a week.    Dispense:  2 mL    Refill:  2   TOUJEO SOLOSTAR 300 UNIT/ML Solostar Pen    Sig: INJECT 42 UNITS SUBCUTANEOUSLY ONCE DAILY    Dispense:  9 mL    Refill:  0   DULoxetine (CYMBALTA) 30 MG capsule    Sig: Take 3 capsules (90 mg total) by mouth daily.    Dispense:  270 capsule    Refill:  3   gabapentin (NEURONTIN) 300 MG capsule    Sig: Take 1 capsule (300 mg total) by mouth 2 (two) times daily AND 2 capsules (600 mg total) at bedtime.    Dispense:  120 capsule    Refill:  2   tirzepatide (MOUNJARO) 7.5 MG/0.5ML Pen    Sig: Inject 7.5 mg into the skin once a week.  Dispense:  2 mL    Refill:  2     Follow up:  1 month    We look forward to seeing you next time. Please call our clinic at [phone] if you have any questions or concerns. The best time to call is Monday-Friday from 9am-4pm, but there is someone available 24/7. If after hours or the weekend, call the main hospital number and ask for the Internal Medicine Resident On-Call. If you need medication refills, please notify your pharmacy one week in advance and they will send [place] a request.   Thank you for trusting me with your care. Wishing you the best!   Rudene Christians, DO [facility]

## 2021-09-23 NOTE — Progress Notes (Unsigned)
Subjective:  CC: diabetes, back pain, and anxiety  HPI:  Kathy Ramirez is a 61 y.o. female with a past medical history stated below and presents today for diabetes, back pain, and anxiety. Please see problem based assessment and plan for additional details.  Past Medical History:  Diagnosis Date   Depression    Diabetes mellitus without complication (HCC)    Thyroid disease     Current Outpatient Medications on File Prior to Visit  Medication Sig Dispense Refill   ipratropium (ATROVENT) 0.06 % nasal spray Place 2 sprays into both nostrils 4 (four) times daily. 15 mL 0   levothyroxine (SYNTHROID) 150 MCG tablet Take 1 tablet (150 mcg total) by mouth daily before breakfast. 90 tablet 2   No current facility-administered medications on file prior to visit.    Family History  Problem Relation Age of Onset   Diabetes Mother    Alzheimer's disease Mother    Lung cancer Mother    Liver cancer Son     Social History   Socioeconomic History   Marital status: Married    Spouse name: Not on file   Number of children: Not on file   Years of education: Not on file   Highest education level: Not on file  Occupational History   Not on file  Tobacco Use   Smoking status: Former    Packs/day: 1.00    Years: 30.00    Total pack years: 30.00    Types: Cigarettes    Quit date: 2012    Years since quitting: 11.5   Smokeless tobacco: Never   Tobacco comments:    quit 2012  Vaping Use   Vaping Use: Never used  Substance and Sexual Activity   Alcohol use: Yes    Comment: Drinks a couple ounces of rum most days of the week   Drug use: Never   Sexual activity: Not on file  Other Topics Concern   Not on file  Social History Narrative   Not on file   Social Determinants of Health   Financial Resource Strain: Not on file  Food Insecurity: Not on file  Transportation Needs: Not on file  Physical Activity: Not on file  Stress: Not on file  Social Connections: Not on  file  Intimate Partner Violence: Not on file    Review of Systems: ROS negative except for what is noted on the assessment and plan.  Objective:   Vitals:   09/23/21 1100  BP: 135/73  Pulse: 73  Temp: 98.9 F (37.2 C)  TempSrc: Oral  SpO2: 98%  Weight: 275 lb 14.4 oz (125.1 kg)  Height: 5\' 7"  (1.702 m)    Physical Exam: Constitutional: well-appearing, in no acute distress Cardiovascular: regular rate and rhythm, no m/r/g Pulmonary/Chest: normal work of breathing on room air, lungs clear to auscultation bilaterally Abdominal: soft, non-tender, non-distended MSK: Tenderness over paraspinal muscles of L2-5, no focal tenderness over vertebral process, sensation intact to lower extremities bilaterally Neurological: alert & oriented x 3, 5/5 strength in bilateral lower extremities, antalgic gait Skin: warm and dry Psych: normal mood and affect     Assessment & Plan:  Diabetes [facility]) Kathy Ramirez is a 61 year old with past medical history of 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  Current medications include Mounjaro 7.5 mg, empagliflozin 10 mg, Toujeo 42 units nightly, and NovoLog 25 units 3 times daily.  Kathy Ramirez initially reported checking her blood sugar multiple times per day.  However on review  of her glucometer Kathy Ramirez has checked her blood sugar about 4 times over the last month.  Readings are consistently between 100-200 with those 4 times.  Kathy Ramirez had difficulty getting Mounjaro and states that Kathy Ramirez did inject the 10 mg dosing a few times and felt nauseous afterwards and that it was not helping her.  Kathy Ramirez is interested in starting medication at lower dosing.  We talked about trying to titrate insulin therapy as we start more agents for diabetes.  Kathy Ramirez is not currently taking metformin and states that Kathy Ramirez took it previously but is unsure why Kathy Ramirez was taken off of this medication. A/P: Hemoglobin A1c from 6.9 in November 2022 to 6.4.  Ideally would start metformin today,  however with patient reporting nausea with Mounjaro 10 mg, I would like to start back Mounjaro at 7.5 mg and make sure Kathy Ramirez is tolerating this medication before making too many changes at the same time.  I will have her follow-up in 1 month.  If Kathy Ramirez is tolerating Mounjaro injections, then would start metformin and titrate insulin therapy down.  I encouraged patient to check her blood sugar 4 times daily and at minimum once daily before eating breakfast.  Kathy Ramirez has previously used a CGM and did not like this as it was easy to come off when Kathy Ramirez was changing clothes or showering.  Chronic lower back pain Patient presents with history of longstanding back pain.  Last imaging in 2009 showed mild disc space loss with intervertebral spurring at L1-3 with mild facet degenerative changes.  Kathy Ramirez states that her pain is worsening.  Kathy Ramirez notices the pain most whenever Kathy Ramirez pulls close out of the washer, bends over to pick up an item, or stands for long periods of time.  Current medications include gabapentin 300 3 times daily with 600 nightly, and duloxetine 60 mg.  Kathy Ramirez was previously referred to physical therapy but did not end up going. On exam Kathy Ramirez has tenderness to palpation over paraspinal muscles along L2-L4, no tenderness over vertebral spine or transverse process.  Kathy Ramirez has 5 out of 5 strength in lower extremities with intact sensation. A/P: Presentation consistent with osteoarthritis of the back.  Kathy Ramirez is not having any radicular symptoms or symptoms of claudication less concerning for spinal stenosis or radiculopathy.  Her pain is affecting her quality of life.  We talked about going to physical therapy to see if this would help with her pain. Referral to physical therapy Increase duloxetine from 60-90 milligrams daily Could consider imaging if pain does not improve  Major depression, chronic Patient presenting with history of depression.  Her PHQ-9 elevated at 18.  Kathy Ramirez denies suicidal ideation or homicidal  ideation.  Kathy Ramirez is currently taking duloxetine 60 mg once daily.  Kathy Ramirez states that Kathy Ramirez takes this at night and does have difficulty with more anxiety when Kathy Ramirez wakes up in the morning.  Kathy Ramirez has considered therapy in the past, but is concerned that it would not be helpful and is not interested at this time. A/P: Increase duloxetine from 60 mg to 90 mg daily   Patient discussed with Dr. Dionne Bucy Averi Cacioppo, D.O. [facility]  PGY-[id number] Pager: [phone]  Phone: [phone] Date 09/24/2021  Time 10:13 AM

## 2021-09-24 LAB — MICROALBUMIN / CREATININE URINE RATIO
Creatinine, Urine: 78.6 mg/dL
Microalb/Creat Ratio: 29 mg/g creat (ref 0–29)
Microalbumin, Urine: 22.9 ug/mL

## 2021-09-24 MED ORDER — PEN NEEDLES 32G X 4 MM MISC: 2 refills | Status: DC

## 2021-09-24 NOTE — Assessment & Plan Note (Signed)
Patient presenting with history of depression.  Her PHQ-9 elevated at 18.  She denies suicidal ideation or homicidal ideation.  She is currently taking duloxetine 60 mg once daily.  She states that she takes this at night and does have difficulty with more anxiety when she wakes up in the morning.  She has considered therapy in the past, but is concerned that it would not be helpful and is not interested at this time. A/P: Increase duloxetine from 60 mg to 90 mg daily

## 2021-09-24 NOTE — Progress Notes (Signed)
Internal Medicine Clinic Attending  Case discussed with Dr. Masters  At the time of the visit.  We reviewed the resident's history and exam and pertinent patient test results.  I agree with the assessment, diagnosis, and plan of care documented in the resident's note.  

## 2021-09-24 NOTE — Assessment & Plan Note (Signed)
Patient presents with history of longstanding back pain.  Last imaging in 2009 showed mild disc space loss with intervertebral spurring at L1-3 with mild facet degenerative changes.  She states that her pain is worsening.  She notices the pain most whenever she pulls close out of the washer, bends over to pick up an item, or stands for long periods of time.  Current medications include gabapentin 300 3 times daily with 600 nightly, and duloxetine 60 mg.  She was previously referred to physical therapy but did not end up going. On exam she has tenderness to palpation over paraspinal muscles along L2-L4, no tenderness over vertebral spine or transverse process.  She has 5 out of 5 strength in lower extremities with intact sensation. A/P: Presentation consistent with osteoarthritis of the back.  She is not having any radicular symptoms or symptoms of claudication less concerning for spinal stenosis or radiculopathy.  Her pain is affecting her quality of life.  We talked about going to physical therapy to see if this would help with her pain. Referral to physical therapy Increase duloxetine from 60-90 milligrams daily Could consider imaging if pain does not improve

## 2021-09-24 NOTE — Assessment & Plan Note (Signed)
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  Current medications include Mounjaro 7.5 mg, empagliflozin 10 mg, Toujeo 42 units nightly, and NovoLog 25 units 3 times daily.  She initially reported checking her blood sugar multiple times per day.  However on review of her glucometer she has checked her blood sugar about 4 times over the last month.  Readings are consistently between 100-200 with those 4 times.  She had difficulty getting Mounjaro and states that she did inject the 10 mg dosing a few times and felt nauseous afterwards and that it was not helping her.  She is interested in starting medication at lower dosing.  We talked about trying to titrate insulin therapy as we start more agents for diabetes.  She is not currently taking metformin and states that she took it previously but is unsure why she was taken off of this medication. A/P: Hemoglobin A1c from 6.9 in November 2022 to 6.4.  Ideally would start metformin today, however with patient reporting nausea with Mounjaro 10 mg, I would like to start back Mounjaro at 7.5 mg and make sure she is tolerating this medication before making too many changes at the same time.  I will have her follow-up in 1 month.  If she is tolerating Mounjaro injections, then would start metformin and titrate insulin therapy down.  I encouraged patient to check her blood sugar 4 times daily and at minimum once daily before eating breakfast.  She has previously used a CGM and did not like this as it was easy to come off when she was changing clothes or showering.

## 2021-09-25 ENCOUNTER — Other Ambulatory Visit (HOSPITAL_COMMUNITY): Payer: Self-pay

## 2021-09-28 ENCOUNTER — Telehealth: Payer: Self-pay

## 2021-09-28 NOTE — Telephone Encounter (Signed)
Pa for pt ( DULOXENTINE  CAPS ) came through on cover my meds was submitted with last office notes.Marland Kitchen awaiting approval  or denial

## 2021-09-28 NOTE — Telephone Encounter (Signed)
DECISION :     Outcome   Approved today   PA Case: [id number], Status: Approved,     Coverage Starts on: 09/28/2021 12:00 AM, Coverage Ends on: 09/29/2022 12:00 AM.    Questions? Contact [phone].   Drug DULoxetine HCl 30MG  dr capsules      ( COPY SENT TO PHARMACY ALSO )

## 2021-09-29 ENCOUNTER — Other Ambulatory Visit (HOSPITAL_COMMUNITY): Payer: Self-pay

## 2021-09-30 ENCOUNTER — Other Ambulatory Visit (HOSPITAL_COMMUNITY): Payer: Self-pay

## 2021-10-22 ENCOUNTER — Encounter: Payer: Self-pay | Admitting: Student

## 2021-10-22 ENCOUNTER — Ambulatory Visit: Payer: 59 | Admitting: Student

## 2021-10-22 VITALS — BP 130/58 | HR 88 | Temp 98.7°F | Wt 276.0 lb

## 2021-10-22 DIAGNOSIS — E11311 Type 2 diabetes mellitus with unspecified diabetic retinopathy with macular edema: Secondary | ICD-10-CM

## 2021-10-22 DIAGNOSIS — G8929 Other chronic pain: Secondary | ICD-10-CM

## 2021-10-22 DIAGNOSIS — Z7984 Long term (current) use of oral hypoglycemic drugs: Secondary | ICD-10-CM

## 2021-10-22 DIAGNOSIS — M545 Low back pain, unspecified: Secondary | ICD-10-CM

## 2021-10-22 DIAGNOSIS — F411 Generalized anxiety disorder: Secondary | ICD-10-CM | POA: Diagnosis not present

## 2021-10-22 MED ORDER — DULOXETINE HCL 30 MG PO CPEP: 60.0000 mg | ORAL_CAPSULE | Freq: Every day | ORAL | 3 refills | Status: DC

## 2021-10-22 MED ORDER — BUSPIRONE HCL 10 MG PO TABS: 10.0000 mg | ORAL_TABLET | Freq: Three times a day (TID) | ORAL | 11 refills | Status: DC

## 2021-10-22 MED ORDER — BUSPIRONE HCL 5 MG PO TABS: 5.0000 mg | ORAL_TABLET | Freq: Three times a day (TID) | ORAL | 3 refills | Status: DC

## 2021-10-22 NOTE — Progress Notes (Signed)
Internal Medicine Clinic Attending  I saw and evaluated the patient.  I personally confirmed the key portions of the history and exam documented by Dr. Lily Kocher and I reviewed pertinent patient test results.  The assessment, diagnosis, and plan were formulated together and I agree with the documentation in the resident's note.    Patient is about to switch from [employer] to [facility], which she thinks will activate on 10/30/21.  Will defer orders for any spinal imaging, PT, and GLP1 until her new insurance is active. Please address these issues at upcoming 9/7 visit.

## 2021-10-22 NOTE — Assessment & Plan Note (Addendum)
Patient continues on Jardiance 10mg , Toujeo 42 units at night, and NovoLog 25u with meals, 3x a day. Patient reports she has not been checking her BG at home as often as she should; glucometer today shows BG checks 5 times since the last time she was seen in clinic with readings ranging 130-145. Of note, patient has been having trouble accessing Mounjaro. She had been started on therapy with this medication for the past few months. She had tried 10 mg dosing prior to last OV, which caused GI distress. At the last OV, she was prescribed a lower dosage; however, patient never picked up medication as her insuraced denied coverage. This current coverage will end on 10/29/2021. Her new coverage will start 10/30/2021 with [employer]. Patient unable to get GLP1 through IM program at this time.   Last A1c 6.1, patient previously losing weight on GLP1, which helped her back pain. It would be beneficial to have patient back on GLP1. Advised patient to bring new card in as soon as she is able so we can continue therapy. At that time, would recommend starting Mounjaro or Victoza at the lowest dose again, as patient has been off of GLP1 therapy for a long period and there is increased likelihood of Gi side effects.  -Follow up in 2 weeks, consider res-starting GLP1 -Continue Jardiance 10mg , Toujeo 42 units at night, and NovoLog 25u with meals, 3x a day

## 2021-10-22 NOTE — Assessment & Plan Note (Signed)
Patient with history chronic low back pain thought to be in the setting of OA returning to clinic for follow up since 7/27 OV at which Duloxetine dose was increased to 90 mg. Patient does not endorse improvement or worsening of pain with new dose; however, she notes this medication is making her too sleepy during the day. She also takes Gabapentin 300 mg TID and 600 mg at night. Agree with OA diagnosis. No significant change in functional status; however, worried about patients increased somnolence. Pain is still limiting patient's daily activities; thus, patient would benefit from imaging studies to guide management. Will plan to have patient return in 2 weeks as her insurance plan is changing. Patient is also amenable to trying PT if it is covered under new plan. -Decrease Duloxetine to 60 mg today -Continue conservative management  -Plan to get films at next visit once patient brings new card - Plan to refer to PT

## 2021-10-22 NOTE — Assessment & Plan Note (Addendum)
Patient noted to have increased anxiety over the past few months over chronic conditions. GAD today 7. PHQ9 of 17; patient mentions her sadness is better than it was in the past few months, no SI. However, her jumpiness and increased worrying gets in the way of life. Patient hoped that after increase in Duloxetine this would resolve, and thinks that because she is sleepier she does not worry as much, but when she is awake, she is still on edge. Discussed behavioral therapy with patient but she is not interested at this time. She does not think this would help as she believes the source of her anxiety is the insurance and medical problems. Spoke about coping strategies and and how therapy is another tool to help managing stress. Plan: - Decrease Duloxetine to 60 mg daily at night - Start Buspar 5 mg TID - Follow up in 2weeks; encourage patient to seek behavioral therapy

## 2021-10-22 NOTE — Progress Notes (Signed)
Subjective:  CC: medication follow up  HPI:  [name] is a 61 y.o. female with a past medical history stated below and presents today for medication management for her anxiety. Please see problem based assessment and plan for additional details.  Past Medical History:  Diagnosis Date   Depression    Diabetes mellitus without complication (HCC)    Thyroid disease     Current Outpatient Medications on File Prior to Visit  Medication Sig Dispense Refill   empagliflozin (JARDIANCE) 10 MG TABS tablet Take 1 tablet (10 mg total) by mouth daily before breakfast. 90 tablet 3   gabapentin (NEURONTIN) 300 MG capsule Take 1 capsule (300 mg total) by mouth 2 (two) times daily AND 2 capsules (600 mg total) at bedtime. 120 capsule 2   Insulin Pen Needle (PEN NEEDLES) 32G X 4 MM MISC Use with novolog 3 times daily and 1 time daily with trojeo 100 each 2   ipratropium (ATROVENT) 0.06 % nasal spray Place 2 sprays into both nostrils 4 (four) times daily. 15 mL 0   levothyroxine (SYNTHROID) 150 MCG tablet Take 1 tablet (150 mcg total) by mouth daily before breakfast. 90 tablet 2   NOVOLOG FLEXPEN 100 UNIT/ML FlexPen Inject 25 Units into the skin 3 (three) times daily with meals. 15 mL 0   tirzepatide (MOUNJARO) 7.5 MG/0.5ML Pen Inject 7.5 mg into the skin once a week. 2 mL 2   TOUJEO SOLOSTAR 300 UNIT/ML Solostar Pen INJECT 42 UNITS SUBCUTANEOUSLY ONCE DAILY 9 mL 0   No current facility-administered medications on file prior to visit.    Family History  Problem Relation Age of Onset   Diabetes Mother    Alzheimer's disease Mother    Lung cancer Mother    Liver cancer Son     Social History   Socioeconomic History   Marital status: Married    Spouse name: Not on file   Number of children: Not on file   Years of education: Not on file   Highest education level: Not on file  Occupational History   Not on file  Tobacco Use   Smoking status: Former    Packs/day: 1.00    Years:  30.00    Total pack years: 30.00    Types: Cigarettes    Quit date: 2012    Years since quitting: 11.6   Smokeless tobacco: Never   Tobacco comments:    quit 2012  Vaping Use   Vaping Use: Never used  Substance and Sexual Activity   Alcohol use: Yes    Comment: Drinks a couple ounces of rum most days of the week   Drug use: Never   Sexual activity: Not on file  Other Topics Concern   Not on file  Social History Narrative   Not on file   Social Determinants of Health   Financial Resource Strain: Not on file  Food Insecurity: Not on file  Transportation Needs: Not on file  Physical Activity: Not on file  Stress: Not on file  Social Connections: Not on file  Intimate Partner Violence: Not on file    Review of Systems: ROS negative except for what is noted on the assessment and plan.  Objective:   Vitals:   10/22/21 0948  BP: (!) 130/58  Pulse: 88  Temp: 98.7 F (37.1 C)  TempSrc: Oral  SpO2: 97%  Weight: 276 lb (125.2 kg)    Physical Exam: Constitutional: well-appearing woman sitting in chair, in no  acute distress HENT: normocephalic atraumatic, mucous membranes moist Neck: supple Cardiovascular: regular rate and rhythm, no m/r/g Pulmonary/Chest: normal work of breathing on room air, lungs clear to auscultation bilaterally Abdominal: soft, non-tender, non-distended MSK:  Patient with tenderness on lower back muscles at the level of L3-L5. No body tenderness. Negative straight raised leg test. Neurological: alert & oriented x 3, 5/5 strength in bilateral upper and lower extremities. Symetrical sensation in lower extremities. antalgic gait. Skin: warm and dry Psych: anxious mood and affect     Assessment & Plan:   Chronic lower back pain Patient with history chronic low back pain thought to be in the setting of OA returning to clinic for follow up since 7/27 OV at which Duloxetine dose was increased to 90 mg. Patient does not endorse improvement or worsening  of pain with new dose; however, she notes this medication is making her too sleepy during the day. She also takes Gabapentin 300 mg TID and 600 mg at night. Agree with OA diagnosis. No significant change in functional status; however, worried about patients increased somnolence. Pain is still limiting patient's daily activities; thus, patient would benefit from imaging studies to guide management. Will plan to have patient return in 2 weeks as her insurance plan is changing. Patient is also amenable to trying PT if it is covered under new plan. -Decrease Duloxetine to 60 mg today -Continue conservative management  -Plan to get films at next visit once patient brings new card - Plan to refer to PT  GAD (generalized anxiety disorder) Patient noted to have increased anxiety over the past few months over chronic conditions. GAD today 7. PHQ9 of 17; patient mentions her sadness is better than it was in the past few months, no SI. However, her jumpiness and increased worrying gets in the way of life. Patient hoped that after increase in Duloxetine this would resolve, and thinks that because she is sleepier she does not worry as much, but when she is awake, she is still on edge. Discussed behavioral therapy with patient but she is not interested at this time. She does not think this would help as she believes the source of her anxiety is the insurance and medical problems. Spoke about coping strategies and and how therapy is another tool to help managing stress. Plan: - Decrease Duloxetine to 60 mg daily at night - Start Buspar 5 mg TID - Follow up in 2weeks; encourage patient to seek behavioral therapy  Diabetes [facility]) Patient continues on Jardiance 10mg , Toujeo 42 units at night, and NovoLog 25u with meals, 3x a day. Patient reports she has not been checking her BG at home as often as she should; glucometer today shows BG checks 5 times since the last time she was seen in clinic with readings ranging  130-145. Of note, patient has been having trouble accessing Mounjaro. She had been started on therapy with this medication for the past few months. She had tried 10 mg dosing prior to last OV, which caused GI distress. At the last OV, she was prescribed a lower dosage; however, patient never picked up medication as her insuraced denied coverage. This current coverage will end on 10/29/2021. Her new coverage will start 10/30/2021 with [employer]. Patient unable to get GLP1 through IM program at this time.   Last A1c 6.1, patient previously losing weight on GLP1, which helped her back pain. It would be beneficial to have patient back on GLP1. Advised patient to bring new card in as soon as  she is able so we can continue therapy. At that time, would recommend starting Mounjaro or Victoza at the lowest dose again, as patient has been off of GLP1 therapy for a long period and there is increased likelihood of Gi side effects.  -Follow up in 2 weeks, consider res-starting GLP1 -Continue Jardiance 10mg , Toujeo 42 units at night, and NovoLog 25u with meals, 3x a day    Patient seen with Dr. 

## 2021-10-22 NOTE — Assessment & Plan Note (Signed)
>>  ASSESSMENT AND PLAN FOR GAD (GENERALIZED ANXIETY DISORDER) WRITTEN ON 10/22/2021  1:17 PM BY GOMEZ-CARABALLO, Lenon Kuennen, MD  Patient noted to have increased anxiety over the past few months over chronic conditions. GAD today 7. PHQ9 of 17; patient mentions her sadness is better than it was in the past few months, no SI. However, her jumpiness and increased worrying gets in the way of life. Patient hoped that after increase in Duloxetine this would resolve, and thinks that because she is sleepier she does not worry as much, but when she is awake, she is still on edge. Discussed behavioral therapy with patient but she is not interested at this time. She does not think this would help as she believes the source of her anxiety is the insurance and medical problems. Spoke about coping strategies and and how therapy is another tool to help managing stress. Plan: - Decrease Duloxetine to 60 mg daily at night - Start Buspar 5 mg TID - Follow up in 2weeks; encourage patient to seek behavioral therapy

## 2021-10-22 NOTE — Patient Instructions (Signed)
Thank you, Ms.Cam T Arline for allowing [place] to provide your care today. Today we discussed your anxiety, your diabetes, and your back pain. Please bring your new insurance card or online form as soon as you are able so we can continue the treatments we spoke about today which include - Continuing Mounjaro treatment - Getting imaging of your spine for your lower back pain  Today, we want you to decrease your Duloxetine to 60 mg at night and start taking Buspirone (buspar) 10 mg three times a day.  Please follow up in 2 weeks for medication management My Chart Access: [email or web address]  Please follow-up in in 2 weeks.  Please make sure to arrive 15 minutes prior to your next appointment. If you arrive late, you may be asked to reschedule.    We look forward to seeing you next time. Please call our clinic at [phone] if you have any questions or concerns. The best time to call is Monday-Friday from 9am-4pm, but there is someone available 24/7. If after hours or the weekend, call the main hospital number and ask for the Internal Medicine Resident On-Call. If you need medication refills, please notify your pharmacy one week in advance and they will send [place] a request.   Thank you for letting us take part in your care. Wishing you the best!  Morene Crocker, MD 10/22/2021, 10:44 AM [facility] Internal Medicine Resident, PGY-1

## 2021-10-27 ENCOUNTER — Other Ambulatory Visit: Payer: Self-pay

## 2021-10-27 DIAGNOSIS — E113313 Type 2 diabetes mellitus with moderate nonproliferative diabetic retinopathy with macular edema, bilateral: Secondary | ICD-10-CM

## 2021-10-27 MED ORDER — TOUJEO SOLOSTAR 300 UNIT/ML ~~LOC~~ SOPN: PEN_INJECTOR | SUBCUTANEOUS | 0 refills | Status: DC

## 2021-10-27 MED ORDER — NOVOLOG FLEXPEN 100 UNIT/ML ~~LOC~~ SOPN: 25.0000 [IU] | PEN_INJECTOR | Freq: Three times a day (TID) | SUBCUTANEOUS | 0 refills | Status: DC

## 2021-10-27 NOTE — Telephone Encounter (Signed)
NOVOLOG FLEXPEN 100 UNIT/ML FlexPen,   TOUJEO SOLOSTAR 300 UNIT/ML Solostar Pen  REFILL REQUEST @ [facility] -  ([place]), [place] - [address]

## 2021-10-27 NOTE — Telephone Encounter (Signed)
Next appt scheduled 9/7 with PCP.

## 2021-11-05 ENCOUNTER — Ambulatory Visit: Payer: BC Managed Care – PPO

## 2021-11-05 ENCOUNTER — Other Ambulatory Visit (HOSPITAL_COMMUNITY): Payer: Self-pay

## 2021-11-05 ENCOUNTER — Telehealth: Payer: Self-pay

## 2021-11-05 ENCOUNTER — Other Ambulatory Visit: Payer: Self-pay

## 2021-11-05 VITALS — BP 92/50 | HR 82 | Temp 98.2°F | Resp 34 | Ht 67.0 in | Wt 278.4 lb

## 2021-11-05 DIAGNOSIS — E113313 Type 2 diabetes mellitus with moderate nonproliferative diabetic retinopathy with macular edema, bilateral: Secondary | ICD-10-CM | POA: Diagnosis not present

## 2021-11-05 DIAGNOSIS — R600 Localized edema: Secondary | ICD-10-CM | POA: Diagnosis not present

## 2021-11-05 DIAGNOSIS — G8929 Other chronic pain: Secondary | ICD-10-CM

## 2021-11-05 DIAGNOSIS — G4710 Hypersomnia, unspecified: Secondary | ICD-10-CM | POA: Insufficient documentation

## 2021-11-05 DIAGNOSIS — F411 Generalized anxiety disorder: Secondary | ICD-10-CM

## 2021-11-05 DIAGNOSIS — M545 Low back pain, unspecified: Secondary | ICD-10-CM

## 2021-11-05 DIAGNOSIS — Z7984 Long term (current) use of oral hypoglycemic drugs: Secondary | ICD-10-CM

## 2021-11-05 MED ORDER — SERTRALINE HCL 50 MG PO TABS: 50.0000 mg | ORAL_TABLET | Freq: Every day | ORAL | 2 refills | Status: DC

## 2021-11-05 MED ORDER — MOUNJARO 2.5 MG/0.5ML ~~LOC~~ SOAJ
2.5000 mg | SUBCUTANEOUS | 0 refills | Status: DC
  Filled 2021-11-05 – 2021-11-09 (×4): qty 2, 28d supply, fill #0

## 2021-11-05 MED ORDER — MOUNJARO 5 MG/0.5ML ~~LOC~~ SOAJ
SUBCUTANEOUS | 0 refills | Status: DC
  Filled 2021-11-05: qty 2, 28d supply, fill #0

## 2021-11-05 MED ORDER — MOUNJARO 5 MG/0.5ML ~~LOC~~ SOAJ
5.0000 mg | SUBCUTANEOUS | 2 refills | Status: DC
Start: 2021-12-04 — End: 2021-11-05
  Filled 2021-11-05: qty 2, 28d supply, fill #0

## 2021-11-05 MED ORDER — NOVOLOG FLEXPEN 100 UNIT/ML ~~LOC~~ SOPN
25.0000 [IU] | PEN_INJECTOR | Freq: Three times a day (TID) | SUBCUTANEOUS | 0 refills | Status: DC
  Filled 2021-11-09: qty 15, 20d supply, fill #0

## 2021-11-05 MED ORDER — TOUJEO SOLOSTAR 300 UNIT/ML ~~LOC~~ SOPN: PEN_INJECTOR | SUBCUTANEOUS | 0 refills | Status: DC

## 2021-11-05 NOTE — Assessment & Plan Note (Signed)
Last A1c 6.4. Home glucometer indicating well controlled with postprandial high of 183 and otherwise fasting averaging ~140. Endorses one hypoglycemic episode. Discussed potential need for lowering mealtime Novolog, especially in the event of weight loss with addition of Mounjaro.  -Continue Jardiance 10 mg, Toujeo 42 units at night, and NovoLog 25 units with meals 3 times a day -Restart Mounjaro at 2.5 mg weekly for 4 weeks and 5 mg weekly thereafter if tolerated

## 2021-11-05 NOTE — Progress Notes (Signed)
CC: GAD, Diabetes mellitus Type 2, chronic low back pain  HPI:  [name] is a 61 y.o. with a past medical history as below who presents for follow-up of her GAD, diabetes mellitus type 2, chronic low back pain.  Past Medical History:  Diagnosis Date   Depression    Diabetes mellitus without complication (HCC)    Thyroid disease    Review of Systems: See detailed assessment and plan for pertinent ROS.  Physical Exam:  Vitals:   11/05/21 0947 11/05/21 0955  BP: (!) 91/42 (!) 92/50  Pulse: 84 82  Resp: (!) 34   Temp: 98.2 F (36.8 C)   TempSrc: Oral   SpO2: 99%   Weight: 278 lb 6.4 oz (126.3 kg)   Height: 5\' 7"  (1.702 m)    Physical Exam Constitutional:      General: She is not in acute distress.    Appearance: She is obese.  HENT:     Head: Normocephalic and atraumatic.  Eyes:     Extraocular Movements: Extraocular movements intact.  Cardiovascular:     Rate and Rhythm: Normal rate and regular rhythm.  Pulmonary:     Effort: Pulmonary effort is normal.     Breath sounds: Normal breath sounds. No wheezing, rhonchi or rales.  Musculoskeletal:     Right lower leg: 1+ Pitting Edema present.     Left lower leg: 1+ Pitting Edema present.  Skin:    General: Skin is warm and dry.  Neurological:     Mental Status: She is alert and oriented to person, place, and time.      Assessment & Plan:   See Encounters Tab for problem based charting.  Diabetes (HCC) Last A1c 6.4. Home glucometer indicating well controlled with postprandial high of 183 and otherwise fasting averaging ~140. Endorses one hypoglycemic episode. Discussed potential need for lowering mealtime Novolog, especially in the event of weight loss with addition of Mounjaro.  -Continue Jardiance 10 mg, Toujeo 42 units at night, and NovoLog 25 units with meals 3 times a day -Restart Mounjaro at 2.5 mg weekly for 4 weeks and 5 mg weekly thereafter if tolerated  GAD (generalized anxiety  disorder) Patient endorses little change in symptoms since last office visit after addition of BuSpar and taking duloxetine down to 60 mg.  She states she is not interested in seeing a therapist at this time.  She continues to endorse predominant symptom of anxiety in the setting of insurance issues and medical problems.  -Discontinue duloxetine and BuSpar given lack of benefit.  Duloxetine was not helping with back pain either. -Start Zoloft 25 mg for 2 weeks and then 50 mg thereafter -Discuss at follow-up in 3 months whether to increase Zoloft or try another SSRI if no benefit  Chronic lower back pain Patient has history of chronic low back pain without radiculopathy or myelopathy.  Patient denies any benefit with duloxetine.  She also has been taking gabapentin 300 mg 3 times daily and additional dose of 600 mg at night.  Will order x-ray lumbar spine.  Patient not amenable to physical therapy at this time.  If kidney function normal, will instruct to take ibuprofen as needed.  She states Tylenol has not worked for her in the past.  -XR spine -NSAIDs pending kidney function -Revisit PT referral at future visit -Discontinue duloxetine given no benefit and likely contributions to somnolence  Bilateral lower extremity edema Patient endorses chronic bilateral lower extremity swelling and tenderness.  2+ pitting  edema on exam today.  She has tried compression stockings in the past but these were uncomfortable for her.  She denies any symptoms of heart failure such as dyspnea on exertion or at rest, orthopnea, or PND.  Lungs clear to auscultation today.  We will get CMP to ensure kidney function and LFTs within normal limits.  Favor chronic venous insufficiency at this point.  -CMP pending -Encourage compression stockings if tolerable  Hypersomnolence disorder, persistent Patient endorses long history of sleep difficulty.  She states she typically goes to sleep as late as 4 AM and will sometimes  not wake up until following evening around 7 PM.  She states this all started a long time ago and she worked third shift.  She states that she does use her computer late into the night but denies caffeine use in the evening.  She has multiple possible contributing factors including depression, chronic pain, diabetes, and hypothyroidism.  Additionally, patient is at risk for sleep apnea given her obesity and denies ever having had a sleep study.  She denies knowledge of any apneic events but does endorse mild snoring.  -Discussed referral to sleep medicine and sleep study but patient is not amenable at this time.  She would like to wait to see if her symptoms improve with her other medication changes. -Revisit this in the future as she is high risk for sleep apnea and this could very well be contributing to her hypersomnolence. -Discontinue Cymbalta and BuSpar.    Patient seen with Dr. Antony Contras

## 2021-11-05 NOTE — Assessment & Plan Note (Signed)
Patient endorses long history of sleep difficulty.  She states she typically goes to sleep as late as 4 AM and will sometimes not wake up until following evening around 7 PM.  She states this all started a long time ago and she worked third shift.  She states that she does use her computer late into the night but denies caffeine use in the evening.  She has multiple possible contributing factors including depression, chronic pain, diabetes, and hypothyroidism.  Additionally, patient is at risk for sleep apnea given her obesity and denies ever having had a sleep study.  She denies knowledge of any apneic events but does endorse mild snoring.  -Discussed referral to sleep medicine and sleep study but patient is not amenable at this time.  She would like to wait to see if her symptoms improve with her other medication changes. -Revisit this in the future as she is high risk for sleep apnea and this could very well be contributing to her hypersomnolence. -Discontinue Cymbalta and BuSpar.

## 2021-11-05 NOTE — Progress Notes (Signed)
Internal Medicine Clinic Attending   I saw and evaluated the patient.  I personally confirmed the key portions of the history and exam documented by Dr. White and I reviewed pertinent patient test results.  The assessment, diagnosis, and plan were formulated together and I agree with the documentation in the resident's note.  

## 2021-11-05 NOTE — Assessment & Plan Note (Signed)
>>  ASSESSMENT AND PLAN FOR GAD (GENERALIZED ANXIETY DISORDER) WRITTEN ON 11/05/2021  1:28 PM BY WHITE, JONATHAN, MD  Patient endorses little change in symptoms since last office visit after addition of BuSpar and taking duloxetine down to 60 mg.  She states she is not interested in seeing a therapist at this time.  She continues to endorse predominant symptom of anxiety in the setting of insurance issues and medical problems.  -Discontinue duloxetine and BuSpar given lack of benefit.  Duloxetine was not helping with back pain either. -Start Zoloft 25 mg for 2 weeks and then 50 mg thereafter -Discuss at follow-up in 3 months whether to increase Zoloft or try another SSRI if no benefit

## 2021-11-05 NOTE — Assessment & Plan Note (Addendum)
Patient endorses little change in symptoms since last office visit after addition of BuSpar and taking duloxetine down to 60 mg.  She states she is not interested in seeing a therapist at this time.  She continues to endorse predominant symptom of anxiety in the setting of insurance issues and medical problems.  -Discontinue duloxetine and BuSpar given lack of benefit.  Duloxetine was not helping with back pain either. -Start Zoloft 25 mg for 2 weeks and then 50 mg thereafter -Discuss at follow-up in 3 months whether to increase Zoloft or try another SSRI if no benefit

## 2021-11-05 NOTE — Assessment & Plan Note (Signed)
Patient endorses chronic bilateral lower extremity swelling and tenderness.  2+ pitting edema on exam today.  She has tried compression stockings in the past but these were uncomfortable for her.  She denies any symptoms of heart failure such as dyspnea on exertion or at rest, orthopnea, or PND.  Lungs clear to auscultation today.  We will get CMP to ensure kidney function and LFTs within normal limits.  Favor chronic venous insufficiency at this point.  -CMP pending -Encourage compression stockings if tolerable

## 2021-11-05 NOTE — Telephone Encounter (Signed)
Prior Authorization for patient (Mounjaro) came through on cover my meds was submitted with last office notes and labs awaiting approval or denial 

## 2021-11-05 NOTE — Patient Instructions (Signed)
[name], it was a pleasure seeing you today!  Today we discussed: Diabetes: We will restart Mounjaro at 2.5 mg for the first 4 weeks. If this is well tolerated, you can go up to 5 mg weekly. We will see you back in 3 months.  GAD: Stop taking your buspar and duloxetine as they have not been much benefit. We will start Zoloft 25 mg daily (break tablet in half). After 2 weeks you can increase to 50 mg daily (full tablet). Sleep: Let us know in the future if you are interested in a sleep study and/or seeing a sleep doctor. In the meantime, please avoid caffeine and computer screens in the evening. Back pain: We will order a spine XR today. I will also make a referral to PT. I will call you back about your lab results - if you kidney function is normal, take ibuprofen as needed for pain. Leg swelling: We are ordering labs to rule out any problems with your kidneys or liver. Your heart does not seem to be the culprit for your swelling. We do encourage compression stockings if possible, but understand that this has been difficult for you in the past.  I have ordered the following labs today:  Lab Orders         CMP14 + Anion Gap      Tests ordered today:  XR spine  Referrals ordered today:   Referral Orders  No referral(s) requested today     I have ordered the following medication/changed the following medications:   Stop the following medications: Medications Discontinued During This Encounter  Medication Reason   DULoxetine (CYMBALTA) 30 MG capsule Ineffective   busPIRone (BUSPAR) 5 MG tablet Ineffective   tirzepatide (MOUNJARO) 7.5 MG/0.5ML Pen      Start the following medications: Meds ordered this encounter  Medications   tirzepatide (MOUNJARO) 5 MG/0.5ML Pen    Sig: Inject 2.5 mg into the skin once a week for 28 days, THEN 5 mg once a week.    Dispense:  7 mL    Refill:  0   sertraline (ZOLOFT) 50 MG tablet    Sig: Take 1 tablet (50 mg total) by mouth daily.     Dispense:  30 tablet    Refill:  2     Follow-up: 3 months   Please make sure to arrive 15 minutes prior to your next appointment. If you arrive late, you may be asked to reschedule.   We look forward to seeing you next time. Please call our clinic at [phone] if you have any questions or concerns. The best time to call is Monday-Friday from 9am-4pm, but there is someone available 24/7. If after hours or the weekend, call the main hospital number and ask for the Internal Medicine Resident On-Call. If you need medication refills, please notify your pharmacy one week in advance and they will send [place] a request.  Thank you for letting us take part in your care. Wishing you the best!  Thank you, Adron Bene, MD

## 2021-11-05 NOTE — Telephone Encounter (Signed)
Decision:  Lorenza Chick Key: [id number] - Rx #: [id number] Need help? Call us at [phone] Outcome Deniedtoday Your request has been denied Drug Mounjaro 5MG /0.5ML pen-injectors Form Blue Form (CB) Original Claim Info 75

## 2021-11-05 NOTE — Assessment & Plan Note (Signed)
Patient has history of chronic low back pain without radiculopathy or myelopathy.  Patient denies any benefit with duloxetine.  She also has been taking gabapentin 300 mg 3 times daily and additional dose of 600 mg at night.  Will order x-ray lumbar spine.  Patient not amenable to physical therapy at this time.  If kidney function normal, will instruct to take ibuprofen as needed.  She states Tylenol has not worked for her in the past.  -XR spine -NSAIDs pending kidney function -Revisit PT referral at future visit -Discontinue duloxetine given no benefit and likely contributions to somnolence

## 2021-11-05 NOTE — Addendum Note (Signed)
Addended by: Adron Bene on: 11/05/2021 03:19 PM   Modules accepted: Orders

## 2021-11-06 ENCOUNTER — Other Ambulatory Visit (HOSPITAL_COMMUNITY): Payer: Self-pay

## 2021-11-06 LAB — CMP14 + ANION GAP
ALT: 50 IU/L — ABNORMAL HIGH (ref 0–32)
AST: 36 IU/L (ref 0–40)
Albumin/Globulin Ratio: 1.6 (ref 1.2–2.2)
Albumin: 4.2 g/dL (ref 3.9–4.9)
Alkaline Phosphatase: 94 IU/L (ref 44–121)
Anion Gap: 19 mmol/L — ABNORMAL HIGH (ref 10.0–18.0)
BUN/Creatinine Ratio: 13 (ref 12–28)
BUN: 12 mg/dL (ref 8–27)
Bilirubin Total: 0.3 mg/dL (ref 0.0–1.2)
CO2: 22 mmol/L (ref 20–29)
Calcium: 9.4 mg/dL (ref 8.7–10.3)
Chloride: 101 mmol/L (ref 96–106)
Creatinine, Ser: 0.89 mg/dL (ref 0.57–1.00)
Globulin, Total: 2.7 g/dL (ref 1.5–4.5)
Glucose: 158 mg/dL — ABNORMAL HIGH (ref 70–99)
Potassium: 4.6 mmol/L (ref 3.5–5.2)
Sodium: 142 mmol/L (ref 134–144)
Total Protein: 6.9 g/dL (ref 6.0–8.5)
eGFR: 73 mL/min/{1.73_m2} (ref >59)

## 2021-11-09 ENCOUNTER — Other Ambulatory Visit (HOSPITAL_COMMUNITY): Payer: Self-pay

## 2021-11-10 ENCOUNTER — Other Ambulatory Visit (HOSPITAL_COMMUNITY): Payer: Self-pay

## 2021-11-11 ENCOUNTER — Other Ambulatory Visit (HOSPITAL_COMMUNITY): Payer: Self-pay

## 2021-11-12 ENCOUNTER — Other Ambulatory Visit: Payer: Self-pay | Admitting: *Deleted

## 2021-11-12 ENCOUNTER — Other Ambulatory Visit (HOSPITAL_COMMUNITY): Payer: Self-pay

## 2021-11-12 ENCOUNTER — Other Ambulatory Visit: Payer: Self-pay

## 2021-11-12 DIAGNOSIS — E113313 Type 2 diabetes mellitus with moderate nonproliferative diabetic retinopathy with macular edema, bilateral: Secondary | ICD-10-CM

## 2021-11-12 MED ORDER — OZEMPIC (0.25 OR 0.5 MG/DOSE) 2 MG/1.5ML ~~LOC~~ SOPN: 0.2500 mg | PEN_INJECTOR | SUBCUTANEOUS | 0 refills | Status: DC

## 2021-11-12 MED ORDER — NOVOLOG FLEXPEN 100 UNIT/ML ~~LOC~~ SOPN
25.0000 [IU] | PEN_INJECTOR | Freq: Three times a day (TID) | SUBCUTANEOUS | 0 refills | Status: DC
  Filled 2021-11-19: qty 33, 44d supply, fill #0

## 2021-11-12 NOTE — Telephone Encounter (Signed)
Call from patient needs the dispense  quantity of her Novolog FlexPens  changed to 33 ml at the same cost.  Patient is using a coupon to help with co-pay.

## 2021-11-12 NOTE — Addendum Note (Signed)
Addended by: Adron Bene on: 11/12/2021 03:12 PM   Modules accepted: Orders

## 2021-11-16 ENCOUNTER — Telehealth: Payer: Self-pay

## 2021-11-16 NOTE — Telephone Encounter (Signed)
Prior Authorization for patient (ozempic) came though on cover my meds was submitted with last office notes awaiting approval or denial.  Your information has been submitted to [place]. [employer] [place] will review the request and notify you of the determination decision directly, typically within 72 hours of receiving all informatio

## 2021-11-17 NOTE — Telephone Encounter (Addendum)
Decision: Approved Morene Crocker (Key: [id number]) Rx #: [id number] Ozempic (0.25 or 0.5 MG/DOSE) 2MG [place] Mediate pen-injectors   Form [employer] [employer] Form (CB) Effective from 11/16/2021 through 11/15/2022.

## 2021-11-19 ENCOUNTER — Other Ambulatory Visit (HOSPITAL_COMMUNITY): Payer: Self-pay

## 2021-11-20 ENCOUNTER — Other Ambulatory Visit (HOSPITAL_COMMUNITY): Payer: Self-pay

## 2021-12-31 ENCOUNTER — Other Ambulatory Visit: Payer: Self-pay | Admitting: Student

## 2021-12-31 DIAGNOSIS — F411 Generalized anxiety disorder: Secondary | ICD-10-CM

## 2021-12-31 DIAGNOSIS — F331 Major depressive disorder, recurrent, moderate: Secondary | ICD-10-CM

## 2022-01-04 ENCOUNTER — Other Ambulatory Visit: Payer: Self-pay

## 2022-01-14 ENCOUNTER — Other Ambulatory Visit (HOSPITAL_COMMUNITY): Payer: Self-pay

## 2022-01-14 MED FILL — Semaglutide Soln Pen-inj 0.25 or 0.5 MG/DOSE (2 MG/3ML): SUBCUTANEOUS | 28 days supply | Qty: 3 | Fill #0 | Status: AC

## 2022-02-04 ENCOUNTER — Ambulatory Visit (INDEPENDENT_AMBULATORY_CARE_PROVIDER_SITE_OTHER): Payer: BC Managed Care – PPO | Admitting: Student

## 2022-02-04 DIAGNOSIS — B349 Viral infection, unspecified: Secondary | ICD-10-CM | POA: Diagnosis not present

## 2022-02-04 DIAGNOSIS — U071 COVID-19: Secondary | ICD-10-CM | POA: Diagnosis not present

## 2022-02-04 DIAGNOSIS — E1165 Type 2 diabetes mellitus with hyperglycemia: Secondary | ICD-10-CM | POA: Diagnosis not present

## 2022-02-05 ENCOUNTER — Encounter: Payer: BC Managed Care – PPO | Admitting: Internal Medicine

## 2022-02-05 DIAGNOSIS — B349 Viral infection, unspecified: Secondary | ICD-10-CM | POA: Insufficient documentation

## 2022-02-05 NOTE — Patient Instructions (Signed)
Thank you so much for coming to the clinic today! Please get a COVID test and let us know what the results are. In the meanwhile, you can try theraflu and robitussin for your cough.  If you have any questions please feel free to the call the clinic at anytime at [phone]. It was a pleasure seeing you!  Best, Dr. Thomasene Ripple

## 2022-02-05 NOTE — Assessment & Plan Note (Signed)
Pt presents via telehealth complaining of a two day history of scratchy, sore throat, productive of yellow mucus cough, chills, fever, and congestion. Her husband tested positive for COVID-19 last Friday, and she states her symptoms are very similar to hers. She herself has not taken a COVID test as of yet. Per encounter this is day two of symptoms. She is not vaccinated for COVID.   Plan:  - Pt does not have a positive COVID test, encouraged patient to obtain one and test herself  - If positive, pt is candidate for paxlovid  - Recommended supportive treatment with Theraflu and hydration

## 2022-02-05 NOTE — Progress Notes (Signed)
   CC: Viral Infection  This is a telephone encounter between [employer] and Nygel Prokop on 02/05/2022 for a virus infection. The visit was conducted with the patient located at home and [place] Divelbiss at [facility]. The patient's identity was confirmed using their DOB and current address. The patient has consented to being evaluated through a telephone encounter and understands the associated risks (an examination cannot be done and the patient may need to come in for an appointment) / benefits (allows the patient to remain at home, decreasing exposure to coronavirus). I personally spent 10 minutes on medical discussion.   HPI:  Ms.Blaire T Wanat is a 61 y.o. with PMH as below.   Please see A&P for assessment of the patient's acute and chronic medical conditions.   Past Medical History:  Diagnosis Date   Depression    Diabetes mellitus without complication (HCC)    Thyroid disease    Review of Systems:    Negative except for what is stated in HPI    Assessment & Plan:   Viral infection Pt presents via telehealth complaining of a two day history of scratchy, sore throat, productive of yellow mucus cough, chills, fever, and congestion. Her husband tested positive for COVID-19 last Friday, and she states her symptoms are very similar to hers. She herself has not taken a COVID test as of yet. Per encounter this is day two of symptoms. She is not vaccinated for COVID.   Plan:  - Pt does not have a positive COVID test, encouraged patient to obtain one and test herself  - If positive, pt is candidate for paxlovid  - Recommended supportive treatment with Theraflu and hydration   Patient discussed with Dr. Maryagnes Amos Kamerin Axford Internal Medicine Resident

## 2022-02-08 NOTE — Progress Notes (Signed)
Internal Medicine Clinic Attending  Case discussed with Dr. Nooruddin  At the time of the visit.  We reviewed the resident's history and exam and pertinent patient test results.  I agree with the assessment, diagnosis, and plan of care documented in the resident's note.  

## 2022-02-16 ENCOUNTER — Other Ambulatory Visit: Payer: Self-pay

## 2022-02-18 ENCOUNTER — Other Ambulatory Visit (HOSPITAL_COMMUNITY): Payer: Self-pay

## 2022-02-18 MED ORDER — OZEMPIC (0.25 OR 0.5 MG/DOSE) 2 MG/3ML ~~LOC~~ SOPN
0.2500 mg | PEN_INJECTOR | SUBCUTANEOUS | 0 refills | Status: DC
  Filled 2022-02-18: qty 3, 56d supply, fill #0

## 2022-03-08 ENCOUNTER — Other Ambulatory Visit (HOSPITAL_COMMUNITY): Payer: Self-pay

## 2022-03-08 DIAGNOSIS — J019 Acute sinusitis, unspecified: Secondary | ICD-10-CM | POA: Diagnosis not present

## 2022-03-08 DIAGNOSIS — H1033 Unspecified acute conjunctivitis, bilateral: Secondary | ICD-10-CM | POA: Diagnosis not present

## 2022-03-08 MED ORDER — AMOXICILLIN-POT CLAVULANATE 875-125 MG PO TABS
1.0000 | ORAL_TABLET | Freq: Two times a day (BID) | ORAL | 0 refills | Status: DC
  Filled 2022-03-08: qty 20, 10d supply, fill #0

## 2022-03-08 MED ORDER — METHYLPREDNISOLONE 4 MG PO TBPK
ORAL_TABLET | ORAL | 0 refills | Status: DC
  Filled 2022-03-08: qty 21, 6d supply, fill #0

## 2022-03-29 ENCOUNTER — Other Ambulatory Visit: Payer: Self-pay

## 2022-03-29 DIAGNOSIS — E113313 Type 2 diabetes mellitus with moderate nonproliferative diabetic retinopathy with macular edema, bilateral: Secondary | ICD-10-CM

## 2022-03-30 ENCOUNTER — Other Ambulatory Visit: Payer: Self-pay

## 2022-03-30 DIAGNOSIS — E113313 Type 2 diabetes mellitus with moderate nonproliferative diabetic retinopathy with macular edema, bilateral: Secondary | ICD-10-CM

## 2022-04-02 ENCOUNTER — Other Ambulatory Visit: Payer: Self-pay

## 2022-04-02 DIAGNOSIS — E113313 Type 2 diabetes mellitus with moderate nonproliferative diabetic retinopathy with macular edema, bilateral: Secondary | ICD-10-CM

## 2022-04-19 ENCOUNTER — Other Ambulatory Visit: Payer: Self-pay

## 2022-04-19 ENCOUNTER — Other Ambulatory Visit (HOSPITAL_COMMUNITY): Payer: Self-pay

## 2022-04-19 DIAGNOSIS — E113313 Type 2 diabetes mellitus with moderate nonproliferative diabetic retinopathy with macular edema, bilateral: Secondary | ICD-10-CM

## 2022-04-20 ENCOUNTER — Other Ambulatory Visit (HOSPITAL_COMMUNITY): Payer: Self-pay

## 2022-04-20 MED ORDER — TOUJEO SOLOSTAR 300 UNIT/ML ~~LOC~~ SOPN: PEN_INJECTOR | SUBCUTANEOUS | 0 refills | Status: DC

## 2022-04-20 MED ORDER — OZEMPIC (0.25 OR 0.5 MG/DOSE) 2 MG/3ML ~~LOC~~ SOPN
0.2500 mg | PEN_INJECTOR | SUBCUTANEOUS | 0 refills | Status: DC
  Filled 2022-04-20: qty 3, 56d supply, fill #0

## 2022-04-20 MED ORDER — NOVOLOG FLEXPEN 100 UNIT/ML ~~LOC~~ SOPN
25.0000 [IU] | PEN_INJECTOR | Freq: Three times a day (TID) | SUBCUTANEOUS | 0 refills | Status: DC
  Filled 2022-04-20: qty 33, 44d supply, fill #0

## 2022-04-23 ENCOUNTER — Other Ambulatory Visit (HOSPITAL_COMMUNITY): Payer: Self-pay

## 2022-04-26 ENCOUNTER — Other Ambulatory Visit (HOSPITAL_COMMUNITY): Payer: Self-pay

## 2022-07-29 ENCOUNTER — Ambulatory Visit: Payer: BC Managed Care – PPO | Admitting: Student

## 2022-07-29 ENCOUNTER — Encounter: Payer: Self-pay | Admitting: Student

## 2022-07-29 VITALS — BP 133/59 | HR 68 | Temp 97.7°F | Wt 281.6 lb

## 2022-07-29 DIAGNOSIS — E113313 Type 2 diabetes mellitus with moderate nonproliferative diabetic retinopathy with macular edema, bilateral: Secondary | ICD-10-CM

## 2022-07-29 DIAGNOSIS — G8929 Other chronic pain: Secondary | ICD-10-CM

## 2022-07-29 DIAGNOSIS — E118 Type 2 diabetes mellitus with unspecified complications: Secondary | ICD-10-CM | POA: Diagnosis not present

## 2022-07-29 DIAGNOSIS — N939 Abnormal uterine and vaginal bleeding, unspecified: Secondary | ICD-10-CM

## 2022-07-29 DIAGNOSIS — Z7984 Long term (current) use of oral hypoglycemic drugs: Secondary | ICD-10-CM

## 2022-07-29 DIAGNOSIS — E113511 Type 2 diabetes mellitus with proliferative diabetic retinopathy with macular edema, right eye: Secondary | ICD-10-CM

## 2022-07-29 DIAGNOSIS — Z6841 Body Mass Index (BMI) 40.0 and over, adult: Secondary | ICD-10-CM

## 2022-07-29 DIAGNOSIS — Z7189 Other specified counseling: Secondary | ICD-10-CM | POA: Insufficient documentation

## 2022-07-29 DIAGNOSIS — E113592 Type 2 diabetes mellitus with proliferative diabetic retinopathy without macular edema, left eye: Secondary | ICD-10-CM

## 2022-07-29 DIAGNOSIS — E039 Hypothyroidism, unspecified: Secondary | ICD-10-CM

## 2022-07-29 DIAGNOSIS — F418 Other specified anxiety disorders: Secondary | ICD-10-CM

## 2022-07-29 DIAGNOSIS — M545 Low back pain, unspecified: Secondary | ICD-10-CM

## 2022-07-29 DIAGNOSIS — E66813 Obesity, class 3: Secondary | ICD-10-CM

## 2022-07-29 LAB — POCT GLYCOSYLATED HEMOGLOBIN (HGB A1C): Hemoglobin A1C: 13.2 % — AB (ref 4.0–5.6)

## 2022-07-29 LAB — GLUCOSE, CAPILLARY: Glucose-Capillary: 271 mg/dL — ABNORMAL HIGH (ref 70–99)

## 2022-07-29 MED ORDER — PEN NEEDLES 32G X 4 MM MISC
2 refills | Status: AC
Start: 2022-07-29 — End: ?

## 2022-07-29 MED ORDER — NOVOLOG FLEXPEN 100 UNIT/ML ~~LOC~~ SOPN
25.0000 [IU] | PEN_INJECTOR | Freq: Three times a day (TID) | SUBCUTANEOUS | 0 refills | Status: DC
Start: 2022-07-29 — End: 2022-08-26

## 2022-07-29 MED ORDER — GABAPENTIN 300 MG PO CAPS
ORAL_CAPSULE | ORAL | 2 refills | Status: DC
Start: 2022-07-29 — End: 2023-07-28

## 2022-07-29 MED ORDER — TOUJEO SOLOSTAR 300 UNIT/ML ~~LOC~~ SOPN: PEN_INJECTOR | SUBCUTANEOUS | 0 refills | Status: DC

## 2022-07-29 MED ORDER — EMPAGLIFLOZIN 10 MG PO TABS
10.0000 mg | ORAL_TABLET | Freq: Every day | ORAL | 3 refills | Status: DC
Start: 2022-07-29 — End: 2022-08-26

## 2022-07-29 NOTE — Assessment & Plan Note (Addendum)
Patient reports that in starting in March of 2024 she began experiencing ~2 month of intermittent painless vaginal bleeding. Volume would vary from spotting to a couple of tablespoons, requiring her to wear pads. First noticed it in her underwear. No blood with bowel movements or in her urine. Patient is not sexually active. She denies prior episodes of bleeding in the past and reports having a uterus. Patient denied orthostatic symptoms after the long period of abnormal vaginal bleeding. She denied abdominal pain or fullness, early satiety,   Patient declined a vaginal exam today despite this provider explaining the importance of inspection to exclude other potential sources of bleeding such as vaginal atrophy or over growths in the vaginal canal.   Of note, upon chart review I see that the patient had a hysteroscopy under anesthesia that showed hyperplastic patter and endometrial overgrowth at age 62. She underwent D&C by Dr. Audie Box, now retired, who reported an empty cavity post procedure.   Given the risk of polyp recurrence and the risk of endometrial carcinoma, will proceed with the following: - Pelvic complete transvaginal ultrasound - CBC and iron studies, including ferritin given history of bleeding - Pending results, will refer to establish with OB-GYN in the [facility] system

## 2022-07-29 NOTE — Assessment & Plan Note (Signed)
Patient reports her back pain has been controlled with duloxetine therapy.  - Continue duloxetine

## 2022-07-29 NOTE — Assessment & Plan Note (Addendum)
This patient has multiple co morbidities and difficulties adhering to medications, requesting medication refills, and following up with providers.  She is at risk for decompensation and would benefit from care coordination. She also has difficulty with transportation when her husband is not available.

## 2022-07-29 NOTE — Assessment & Plan Note (Addendum)
Patient initially on Lexapro but this was There are multiple assessment tabs about this patient's anxiety and depression. Initially on Duloxetine, increased up to 90 mg daily. Then started on Sertraline, which patient discontinued earlier this year as she did not experience benefit.      07/29/2022    9:45 AM  Depression screen PHQ 2/9  Decreased Interest 2  Down, Depressed, Hopeless 2  PHQ - 2 Score 4  Altered sleeping 3  Tired, decreased energy 2  Change in appetite 2  Feeling bad or failure about yourself  2  Trouble concentrating 2  Moving slowly or fidgety/restless 3  Suicidal thoughts 1  PHQ-9 Score 19  Difficult doing work/chores Somewhat difficult       10/22/2021    9:56 AM 02/09/2021    9:58 AM 10/07/2020    8:54 AM  GAD 7 : Generalized Anxiety Score  Nervous, Anxious, on Edge 1 2 2   Control/stop worrying 1 2 2   Worry too much - different things 1 2 3   Trouble relaxing 1 2 2   Restless 0 1 1  Easily annoyed or irritable 1 0 1  Afraid - awful might happen 2 1 2   Total GAD 7 Score 7 10 13   Anxiety Difficulty  Very difficult Somewhat difficult   Reviewed recent notes; Duloxetine and Buspar had been discontinued on 9/23. However, per patient and on chart review, she has continued taking these medications. Ran out of time during this interview, not before addressing that patient does not have a plan to end her life or have active SI.   Recommended she return in one month to discussed her depression and treatment options. Initially thought about starting patient on Citalopram, but because her polypharmacy and lack of proper medication reconciliation at the time this interview was conducted I did not deem it was safe to add another medication. Will keep the Buspar and Duloxetine for now as patient is getting chronic back pain relief from duloxetine. Would consider adding citalopram at next visit. Plan - Referral to integrated behavioral services for CBT - Follow up in one month  for antidepressant medication reconcilitation and starting therapy - If changing medications at next visit, please discontinue medications/refills

## 2022-07-29 NOTE — Progress Notes (Signed)
Subjective:  CC: Chronic condition follow up  HPI:  [name] is a 62 y.o. female with a past medical history stated below and presents today for diabetes, hypothyroidism, and abnormal uterine bleeding. Please see problem based assessment and plan for additional details.  Past Medical History:  Diagnosis Date   Depression    Diabetes mellitus without complication (HCC)    Thyroid disease     Current Outpatient Medications on File Prior to Visit  Medication Sig Dispense Refill   DULoxetine (CYMBALTA) 30 MG capsule Take 90 mg by mouth daily.     busPIRone (BUSPAR) 5 MG tablet Take 5 mg by mouth 3 (three) times daily.     ipratropium (ATROVENT) 0.06 % nasal spray Place 2 sprays into both nostrils 4 (four) times daily. 15 mL 0   levothyroxine (SYNTHROID) 150 MCG tablet Take 1 tablet (150 mcg total) by mouth daily before breakfast. 90 tablet 2   No current facility-administered medications on file prior to visit.    Family History  Problem Relation Age of Onset   Diabetes Mother    Alzheimer's disease Mother    Lung cancer Mother    Liver cancer Son     Social History   Socioeconomic History   Marital status: Married    Spouse name: Not on file   Number of children: Not on file   Years of education: Not on file   Highest education level: Not on file  Occupational History   Not on file  Tobacco Use   Smoking status: Former    Packs/day: 1.00    Years: 30.00    Additional pack years: 0.00    Total pack years: 30.00    Types: Cigarettes    Quit date: 2012    Years since quitting: 12.4   Smokeless tobacco: Never   Tobacco comments:    quit 2012  Vaping Use   Vaping Use: Never used  Substance and Sexual Activity   Alcohol use: Yes    Comment: Drinks a couple ounces of rum most days of the week   Drug use: Never   Sexual activity: Not on file  Other Topics Concern   Not on file  Social History Narrative   Not on file   Social Determinants of Health    Financial Resource Strain: Not on file  Food Insecurity: Not on file  Transportation Needs: Not on file  Physical Activity: Not on file  Stress: Not on file  Social Connections: Not on file  Intimate Partner Violence: Not on file    Review of Systems: ROS negative except for what is noted on the assessment and plan.  Objective:   Vitals:   07/29/22 0905  BP: (!) 133/59  Pulse: 68  Temp: 97.7 F (36.5 C)  TempSrc: Oral  SpO2: 99%  Weight: 281 lb 9.6 oz (127.7 kg)    Physical Exam: Constitutional: well-appearing woman sitting in exam chair, in no acute distress HENT: normocephalic atraumatic, mucous membranes moist Eyes: conjunctiva non-erythematous Neck: supple Cardiovascular: regular rate and rhythm, no m/r/g Pulmonary/Chest: normal work of breathing on room air, lungs clear to auscultation bilaterally Abdominal: soft, non-tender, non-distended MSK: normal bulk and tone Neurological: alert & oriented x 3, 5/5 strength in bilateral upper and lower extremities, normal gait Skin: warm and dry Psych: Depressed mood and affect     Assessment & Plan:   Diabetes mellitus type 2 with complications [facility]) Patient states that she has been doing Toujeo and  Novolog. She has extra pens at home and has adhered to these two medications. She has not been able to afford Jardiance 10 mg daily. Patient has significant nausea and diarrhea with Ozempic. Patient did not have these symptoms with Mounjaro but her insurance did not cover it/failed PA.   Reviewed the medication dispense history for the past month and patient has not gotten refills for these medications since February of 2024. She does not check her blood glucose at home either. Denies polydipsia or polyuria today.   Patient reports she has been having difficulty adhering to a diet and staying motivated to exercise. She request help with nutrition guidance today.  Lab Results  Component Value Date   HGBA1C 13.2 (A)  07/29/2022   Uncontrolled diabetes as above. Not currently on statin or a recent renal function panel or lipid panel on file. Congratulated patient on re-establishing with [place] and emphasized need for continued care for diabetes and multiple conditions.  - Will restart Toujeo 42 units nightly and Novolog 25 with meals for the next 3 months - Patient to bring BG log; will have to adjust medications based on these - Restarted patient on Jardiance 10 mg daily now that she has insurance - BMP, Lipid panel today - Urine microalbumin/creatinine ratio today - Referral to Diabetes educator for nutrition and diabetes education and glucose monitoring practices - Will plan to re-start on GLP-1RA in the near furure once patient demonstrates ability to adhere to current plan  Abnormal uterine bleeding Patient reports that in starting in March of 2024 she began experiencing ~2 month of intermittent painless vaginal bleeding. Volume would vary from spotting to a couple of tablespoons, requiring her to wear pads. First noticed it in her underwear. No blood with bowel movements or in her urine. Patient is not sexually active. She denies prior episodes of bleeding in the past and reports having a uterus. Patient denied orthostatic symptoms after the long period of abnormal vaginal bleeding. She denied abdominal pain or fullness, early satiety,   Patient declined a vaginal exam today despite this provider explaining the importance of inspection to exclude other potential sources of bleeding such as vaginal atrophy or over growths in the vaginal canal.   Of note, upon chart review I see that the patient had a hysteroscopy under anesthesia that showed hyperplastic patter and endometrial overgrowth at age 57. She underwent D&C by Dr. Audie Box, now retired, who reported an empty cavity post procedure.   Given the risk of polyp recurrence and the risk of endometrial carcinoma, will proceed with the following: - Pelvic  complete transvaginal ultrasound - CBC and iron studies, including ferritin given history of bleeding - Pending results, will refer to establish with OB-GYN in the [facility]    Hypothyroidism Patient has been out of medication for a very long time and has experienced stress when calling for medication refills. The last time this was refilled was on 09/2021.   Patient is experiencing fatigue, worsening weight gain, periorbital edema, though she reports this has been a long standing feature of hers. no pretibial edema, constipation or braydcardia.   I will not refill this medication until TSH is rechecked during this visit.   - TSH today - Will plan to restart Levothyroxine pending results  Complex care coordination This patient has multiple co morbidities and difficulties adhering to medications, requesting medication refills, and following up with providers.  She is at risk for decompensation and would benefit from care coordination. She also has  difficulty with transportation when her husband is not available.   Depression with anxiety Patient initially on Lexapro but this was There are multiple assessment tabs about this patient's anxiety and depression. Initially on Duloxetine, increased up to 90 mg daily. Then started on Sertraline, which patient discontinued earlier this year as she did not experience benefit.      07/29/2022    9:45 AM  Depression screen PHQ 2/9  Decreased Interest 2  Down, Depressed, Hopeless 2  PHQ - 2 Score 4  Altered sleeping 3  Tired, decreased energy 2  Change in appetite 2  Feeling bad or failure about yourself  2  Trouble concentrating 2  Moving slowly or fidgety/restless 3  Suicidal thoughts 1  PHQ-9 Score 19  Difficult doing work/chores Somewhat difficult       10/22/2021    9:56 AM 02/09/2021    9:58 AM 10/07/2020    8:54 AM  GAD 7 : Generalized Anxiety Score  Nervous, Anxious, on Edge 1 2 2   Control/stop worrying 1 2 2   Worry too much -  different things 1 2 3   Trouble relaxing 1 2 2   Restless 0 1 1  Easily annoyed or irritable 1 0 1  Afraid - awful might happen 2 1 2   Total GAD 7 Score 7 10 13   Anxiety Difficulty  Very difficult Somewhat difficult   Reviewed recent notes; Duloxetine and Buspar had been discontinued on 9/23. However, per patient and on chart review, she has continued taking these medications. Ran out of time during this interview, not before addressing that patient does not have a plan to end her life or have active SI.   Recommended she return in one month to discussed her depression and treatment options. Initially thought about starting patient on Citalopram, but because her polypharmacy and lack of proper medication reconciliation at the time this interview was conducted I did not deem it was safe to add another medication. Will keep the Buspar and Duloxetine for now as patient is getting chronic back pain relief from duloxetine. Would consider adding citalopram at next visit. Plan - Referral to integrated behavioral services for CBT - Follow up in one month for antidepressant medication reconcilitation and starting therapy - If changing medications at next visit, please discontinue medications/refills  Chronic lower back pain Patient reports her back pain has been controlled with duloxetine therapy.  - Continue duloxetine  Class 3 severe obesity due to excess calories with serious comorbidity and body mass index (BMI) of 40.0 to 44.9 in adult [facility]) Reviewed life style modifications to help manage her DM, back pain, and cardiovascular risk factors    Return in about 4 weeks (around 08/26/2022) for Anxiety/depression.  Patient discussed with Dr. Rosalia Hammers, MD [facility] Internal Medicine Program - PGY-1 07/29/2022, 2:23 PM

## 2022-07-29 NOTE — Assessment & Plan Note (Signed)
Patient has been out of medication for a very long time and has experienced stress when calling for medication refills. The last time this was refilled was on 09/2021.   Patient is experiencing fatigue, worsening weight gain, periorbital edema, though she reports this has been a long standing feature of hers. no pretibial edema, constipation or braydcardia.   I will not refill this medication until TSH is rechecked during this visit.   - TSH today - Will plan to restart Levothyroxine pending results

## 2022-07-29 NOTE — Assessment & Plan Note (Addendum)
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 She has extra pens at home and has adhered to these two medications. She has not been able to afford Jardiance 10 mg daily. Patient has significant nausea and diarrhea with Ozempic. Patient did not have these symptoms with Mounjaro but her insurance did not cover it/failed PA.   Reviewed the medication dispense history for the past month and patient has not gotten refills for these medications since February of 2024. She does not check her blood glucose at home either. Denies polydipsia or polyuria today.   Patient reports she has been having difficulty adhering to a diet and staying motivated to exercise. She request help with nutrition guidance today.  Lab Results  Component Value Date   HGBA1C 13.2 (A) 07/29/2022   Uncontrolled diabetes as above. Not currently on statin or a recent renal function panel or lipid panel on file. Congratulated patient on re-establishing with [place] and emphasized need for continued care for diabetes and multiple conditions.  - Will restart Toujeo 42 units nightly and Novolog 25 with meals for the next 3 months - Patient to bring BG log; will have to adjust medications based on these - Restarted patient on Jardiance 10 mg daily now that she has insurance - BMP, Lipid panel today - Urine microalbumin/creatinine ratio today - Referral to Diabetes educator for nutrition and diabetes education and glucose monitoring practices - Will plan to re-start on GLP-1RA in the near furure once patient demonstrates ability to adhere to current plan

## 2022-07-29 NOTE — Assessment & Plan Note (Signed)
Reviewed life style modifications to help manage her DM, back pain, and cardiovascular risk factors

## 2022-07-29 NOTE — Patient Instructions (Addendum)
Thank you, Ms.Khristin T Goody for allowing [place] to provide your care today. Today we discussed   Diabetes - Please pick up your medications - Check your blood sugar fasting and before using your novolog  Vaginal bleeding - Please follow up with the transvaginal ultrasound to check your uterus  Other healthcare maintenance - thyroid - I will refill your medication when I get the blood tests back     I have ordered the following labs for you:   Lab Orders         Lipid Profile         BMP8+Anion Gap         Microalbumin / Creatinine Urine Ratio         TSH         CBC no Diff         Urinalysis, Reflex Microscopic         POC Hbg A1C         POCT HgB A1c and Glucose      I will call if any are abnormal. All of your labs can be accessed through "My Chart".   My Chart Access: [email or web address]  Please follow-up in: 1 month for anxiety and depression    We look forward to seeing you next time. Please call our clinic at [phone] if you have any questions or concerns. The best time to call is Monday-Friday from 9am-4pm, but there is someone available 24/7. If after hours or the weekend, call the main hospital number and ask for the Internal Medicine Resident On-Call. If you need medication refills, please notify your pharmacy one week in advance and they will send [place] a request.   Thank you for letting us take part in your care. Wishing you the best!  Morene Crocker, MD 07/29/2022, 9:15 AM [facility] Internal Medicine Resident, PGY-1

## 2022-07-30 ENCOUNTER — Other Ambulatory Visit: Payer: Self-pay | Admitting: Student

## 2022-07-30 DIAGNOSIS — E039 Hypothyroidism, unspecified: Secondary | ICD-10-CM

## 2022-07-30 LAB — BMP8+ANION GAP
Anion Gap: 16 mmol/L (ref 10.0–18.0)
BUN/Creatinine Ratio: 14 (ref 12–28)
BUN: 12 mg/dL (ref 8–27)
CO2: 23 mmol/L (ref 20–29)
Calcium: 9.1 mg/dL (ref 8.7–10.3)
Chloride: 101 mmol/L (ref 96–106)
Creatinine, Ser: 0.84 mg/dL (ref 0.57–1.00)
Glucose: 264 mg/dL — ABNORMAL HIGH (ref 70–99)
Potassium: 4.4 mmol/L (ref 3.5–5.2)
Sodium: 140 mmol/L (ref 134–144)
eGFR: 78 mL/min/{1.73_m2} (ref >59)

## 2022-07-30 LAB — CBC
Hematocrit: 47 % — ABNORMAL HIGH (ref 34.0–46.6)
Hemoglobin: 15.4 g/dL (ref 11.1–15.9)
MCH: 30 pg (ref 26.6–33.0)
MCHC: 32.8 g/dL (ref 31.5–35.7)
MCV: 91 fL (ref 79–97)
Platelets: 251 10*3/uL (ref 150–450)
RBC: 5.14 x10E6/uL (ref 3.77–5.28)
RDW: 12.7 % (ref 11.7–15.4)
WBC: 7.7 10*3/uL (ref 3.4–10.8)

## 2022-07-30 LAB — TSH: TSH: 16.2 u[IU]/mL — ABNORMAL HIGH (ref 0.450–4.500)

## 2022-07-30 LAB — MICROALBUMIN / CREATININE URINE RATIO
Creatinine, Urine: 188.1 mg/dL
Microalb/Creat Ratio: 25 mg/g creat (ref 0–29)
Microalbumin, Urine: 46.1 ug/mL

## 2022-07-30 LAB — LIPID PANEL
Chol/HDL Ratio: 4.3 ratio (ref 0.0–4.4)
Cholesterol, Total: 182 mg/dL (ref 100–199)
HDL: 42 mg/dL (ref >39)
LDL Chol Calc (NIH): 120 mg/dL — ABNORMAL HIGH (ref 0–99)
Triglycerides: 111 mg/dL (ref 0–149)
VLDL Cholesterol Cal: 20 mg/dL (ref 5–40)

## 2022-07-30 LAB — IRON,TIBC AND FERRITIN PANEL
Ferritin: 267 ng/mL — ABNORMAL HIGH (ref 15–150)
Iron Saturation: 20 % (ref 15–55)
Iron: 60 ug/dL (ref 27–139)
Total Iron Binding Capacity: 299 ug/dL (ref 250–450)
UIBC: 239 ug/dL (ref 118–369)

## 2022-07-30 MED ORDER — LEVOTHYROXINE SODIUM 75 MCG PO TABS
75.0000 ug | ORAL_TABLET | Freq: Every day | ORAL | 11 refills | Status: DC
Start: 2022-07-30 — End: 2022-09-13

## 2022-07-30 NOTE — Progress Notes (Signed)
Ferritin elevated with normal iron and iron saturation. Will repeat this test in a month or two to trend. LDL 120 and uncontrolled DM. Discussed need for moderate statin initiation in this patient but she defers until she is followed up in clinic in a month.Otherwise, normal renal function and electrolytes. No evidence of anemia.  Elevated TSH after stopping synthroid.  New prescription sent at a lower dose (76 mcg) daily. Reviewed how to take.

## 2022-07-30 NOTE — Progress Notes (Signed)
Internal Medicine Clinic Attending  Case discussed with Dr. Gomez-Caraballo  At the time of the visit.  We reviewed the resident's history and exam and pertinent patient test results.  I agree with the assessment, diagnosis, and plan of care documented in the resident's note.  

## 2022-08-02 ENCOUNTER — Telehealth: Payer: Self-pay | Admitting: *Deleted

## 2022-08-02 NOTE — Progress Notes (Unsigned)
  Care Coordination  Outreach Note  08/02/2022 Name: Kathy Ramirez MRN: [id number] DOB: 1960-04-22   Care Coordination Outreach Attempts: An unsuccessful telephone outreach was attempted today to offer the patient information about available care coordination services.  Follow Up Plan:  Additional outreach attempts will be made to offer the patient care coordination information and services.   Encounter Outcome:  No Answer  Christie Nottingham  Care Coordination Care Guide  Direct Dial: [phone]

## 2022-08-03 ENCOUNTER — Telehealth: Payer: Self-pay

## 2022-08-03 NOTE — Progress Notes (Signed)
   Care Guide Note  08/03/2022 Name: Kathy Ramirez MRN: [id number] DOB: [birth year]  Referred by: Morene Crocker, MD Reason for referral : Care Coordination (Outreach to schedule with Pharm d )   Kathy Ramirez is a 62 y.o. year old female who is a primary care patient of Morene Crocker, MD. Kathy Ramirez was referred to the pharmacist for assistance related to DM.    An unsuccessful telephone outreach was attempted today to contact the patient who was referred to the pharmacy team for assistance with medication management. Additional attempts will be made to contact the patient.   Penne Lash, RMA Care Guide [facility]  [place], [place] [address] Direct Dial: [phone] [email or web address]

## 2022-08-03 NOTE — Progress Notes (Unsigned)
  Care Coordination  Outreach Note  08/03/2022 Name: ALFARETTA SCHOENECKER MRN: [id number] DOB: 12-09-1960   Care Coordination Outreach Attempts: A second unsuccessful outreach was attempted today to offer the patient with information about available care coordination services.  Follow Up Plan:  Additional outreach attempts will be made to offer the patient care coordination information and services.   Encounter Outcome:  No Answer  Christie Nottingham  Care Coordination Care Guide  Direct Dial: [phone]

## 2022-08-05 NOTE — Progress Notes (Signed)
  Care Coordination  Outreach Note  08/05/2022 Name: Kathy Ramirez MRN: [id number] DOB: [birth year]   Care Coordination Outreach Attempts: A third unsuccessful outreach was attempted today to offer the patient with information about available care coordination services.  Follow Up Plan:  No further outreach attempts will be made at this time. We have been unable to contact the patient to offer or enroll patient in care coordination services  Encounter Outcome:  No Answer  Christie Nottingham  Care Coordination Care Guide  Direct Dial: [phone]

## 2022-08-18 ENCOUNTER — Ambulatory Visit (HOSPITAL_COMMUNITY): Payer: BC Managed Care – PPO

## 2022-08-20 NOTE — Progress Notes (Signed)
   Care Guide Note  08/20/2022 Name: Kathy Ramirez MRN: [id number] DOB: 12/18/1960  Referred by: Morene Crocker, MD Reason for referral : Care Coordination (Outreach to schedule with Pharm d )   Kathy Ramirez is a 62 y.o. year old female who is a primary care patient of Morene Crocker, MD. Kathy Ramirez was referred to the pharmacist for assistance related to DM.    A second unsuccessful telephone outreach was attempted today to contact the patient who was referred to the pharmacy team for assistance with medication management. Additional attempts will be made to contact the patient. [facility] also made 3 unsuccessful calls   Penne Lash, RMA Care Guide [facility]  [place], [place] [address] Direct Dial: [phone] [email or web address]

## 2022-08-26 ENCOUNTER — Other Ambulatory Visit (HOSPITAL_COMMUNITY): Payer: Self-pay

## 2022-08-26 ENCOUNTER — Ambulatory Visit: Payer: BC Managed Care – PPO | Admitting: Internal Medicine

## 2022-08-26 ENCOUNTER — Encounter: Payer: Self-pay | Admitting: Internal Medicine

## 2022-08-26 VITALS — BP 96/58 | HR 95 | Ht 67.0 in | Wt 281.0 lb

## 2022-08-26 DIAGNOSIS — E118 Type 2 diabetes mellitus with unspecified complications: Secondary | ICD-10-CM

## 2022-08-26 DIAGNOSIS — E039 Hypothyroidism, unspecified: Secondary | ICD-10-CM | POA: Diagnosis not present

## 2022-08-26 DIAGNOSIS — Z794 Long term (current) use of insulin: Secondary | ICD-10-CM | POA: Diagnosis not present

## 2022-08-26 DIAGNOSIS — Z1231 Encounter for screening mammogram for malignant neoplasm of breast: Secondary | ICD-10-CM

## 2022-08-26 MED ORDER — EMPAGLIFLOZIN 25 MG PO TABS
25.0000 mg | ORAL_TABLET | Freq: Every day | ORAL | 3 refills | Status: DC
Start: 2022-08-26 — End: 2023-07-28
  Filled 2022-08-26: qty 90, 90d supply, fill #0
  Filled 2022-10-10: qty 90, 90d supply, fill #1
  Filled 2022-12-01: qty 30, 30d supply, fill #1
  Filled 2023-07-28: qty 90, 90d supply, fill #1

## 2022-08-26 MED ORDER — NOVOLOG FLEXPEN 100 UNIT/ML ~~LOC~~ SOPN
25.0000 [IU] | PEN_INJECTOR | Freq: Three times a day (TID) | SUBCUTANEOUS | 0 refills | Status: DC
Start: 2022-08-26 — End: 2022-10-25
  Filled 2022-08-26: qty 33, 44d supply, fill #0

## 2022-08-26 MED ORDER — TOUJEO SOLOSTAR 300 UNIT/ML ~~LOC~~ SOPN
PEN_INJECTOR | SUBCUTANEOUS | 0 refills | Status: DC
Start: 2022-08-26 — End: 2022-10-25
  Filled 2022-08-26: qty 9, 60d supply, fill #0

## 2022-08-26 NOTE — Patient Instructions (Signed)
Thank you, [name] for allowing [place] to provide your care today.   Diabetes Please check blood sugar in the morning when you wake up and with each meal. This helps decrease risk of low blood sugars which can be life threatening. Bring in glucometer to appointment with Norm Parcel on July 10th. I am increasing empagliflozin to 25 mg.   Thyroid It is a little early to recheck thyroid labs. I would like you to come in for a lab only visit when you come back to see Kathy Ramirez on July 10th. I will message front desk about that.  Be sure you are drinking plenty of fluids with the summer heat.  I think you could benefit from talking with someone. Please let me know if you are interested.  I have ordered the following labs for you:   Lab Orders         TSH         T4, Free      I have ordered the following medication/changed the following medications:   Stop the following medications: Medications Discontinued During This Encounter  Medication Reason   empagliflozin (JARDIANCE) 10 MG TABS tablet    NOVOLOG FLEXPEN 100 UNIT/ML FlexPen Reorder   TOUJEO SOLOSTAR 300 UNIT/ML Solostar Pen Reorder     Start the following medications: Meds ordered this encounter  Medications   empagliflozin (JARDIANCE) 25 MG TABS tablet    Sig: Take 1 tablet (25 mg total) by mouth daily before breakfast.    Dispense:  90 tablet    Refill:  3   TOUJEO SOLOSTAR 300 UNIT/ML Solostar Pen    Sig: INJECT 42 UNITS SUBCUTANEOUSLY ONCE DAILY    Dispense:  9 mL    Refill:  0   NOVOLOG FLEXPEN 100 UNIT/ML FlexPen    Sig: Inject 25 Units into the skin 3 (three) times daily with meals.    Dispense:  33 mL    Refill:  0     Follow up:  1 month    We look forward to seeing you next time. Please call our clinic at [phone] if you have any questions or concerns. The best time to call is Monday-Friday from 9am-4pm, but there is someone available 24/7. If after hours or the weekend, call the main hospital  number and ask for the Internal Medicine Resident On-Call. If you need medication refills, please notify your pharmacy one week in advance and they will send [place] a request.   Thank you for trusting me with your care. Wishing you the best!   Rudene Christians, DO [facility]

## 2022-08-26 NOTE — Progress Notes (Addendum)
Subjective:  CC: diabetes  HPI:  [name] is a 62 y.o. female with a past medical history stated below and presents today for follow-up on diabetes. She was lost to follow-up for several months and stopped taking medications. A1c elevated in May to 13.2. Please see problem based assessment and plan for additional details.  Past Medical History:  Diagnosis Date   Depression    Diabetes mellitus with complication (HCC)    Diabetic retinopathy (HCC)    Thyroid disease     Current Outpatient Medications on File Prior to Visit  Medication Sig Dispense Refill   busPIRone (BUSPAR) 5 MG tablet Take 5 mg by mouth 3 (three) times daily.     DULoxetine (CYMBALTA) 30 MG capsule Take 90 mg by mouth daily.     gabapentin (NEURONTIN) 300 MG capsule Take 1 capsule (300 mg total) by mouth 2 (two) times daily AND 2 capsules (600 mg total) at bedtime. 120 capsule 2   Insulin Pen Needle (PEN NEEDLES) 32G X 4 MM MISC Use with novolog 3 times daily and 1 time daily with trojeo 100 each 2   ipratropium (ATROVENT) 0.06 % nasal spray Place 2 sprays into both nostrils 4 (four) times daily. 15 mL 0   No current facility-administered medications on file prior to visit.    Family History  Problem Relation Age of Onset   Diabetes Mother    Alzheimer's disease Mother    Lung cancer Mother    Liver cancer Son     Social History   Socioeconomic History   Marital status: Married    Spouse name: Not on file   Number of children: Not on file   Years of education: Not on file   Highest education level: Not on file  Occupational History   Not on file  Tobacco Use   Smoking status: Former    Current packs/day: 0.00    Average packs/day: 1 pack/day for 30.0 years (30.0 ttl pk-yrs)    Types: Cigarettes    Start date: 27    Quit date: 2012    Years since quitting: 12.5   Smokeless tobacco: Never   Tobacco comments:    quit 2012  Vaping Use   Vaping status: Never Used  Substance and  Sexual Activity   Alcohol use: Yes    Comment: Drinks a couple ounces of rum most days of the week   Drug use: Never   Sexual activity: Not on file  Other Topics Concern   Not on file  Social History Narrative   Not on file   Social Determinants of Health   Financial Resource Strain: Not on file  Food Insecurity: Not on file  Transportation Needs: Not on file  Physical Activity: Not on file  Stress: Not on file  Social Connections: Not on file  Intimate Partner Violence: Not on file    Review of Systems: ROS negative except for what is noted on the assessment and plan.  Objective:   Vitals:   08/26/22 1045 08/26/22 1112  BP: 136/78 (!) 96/58  Pulse: 92 95  SpO2: 99%   Weight: 281 lb (127.5 kg)   Height: 5\' 7"  (1.702 m)     Physical Exam: Constitutional: well-appearing Cardiovascular: regular rate and rhythm, no m/r/g Pulmonary/Chest: normal work of breathing on room air, lungs clear to auscultation bilaterally Abdominal: soft, non-tender, non-distended MSK: normal bulk and tone Skin: warm and dry   Assessment & Plan:  Diabetes mellitus type  2 with complications [facility]) Lab Results  Component Value Date   HGBA1C 13.2 (A) 07/29/2022  Medications: Toujeo 42 units at bedtime, Empagliflozin 10 mg every day, Novolog 24 units TID  Since restarting medications last month she has been feeling ok. She is adherent with toujeo and empagliflozin but does not eat regular meals so typically doesn't take 3 doses of novolog. She has a glucometer but did not bring it today. She checks her blood sugar at least once a day but ids unsure of exact readings. She has follow-up with Norm Parcel on July 10th. In the past she used a CGM but had trouble getting sensors to stay on. She has previously been on GLP-1 but would not want to start again as did not achieve long term weight loss once she stopped medications. P: F/u with Lupita Leash July 19th, asked patient to bring in glucometer for  download. Will message Lupita Leash to see if she could send report to me for review. Continue toujeo 42 units and novolog 24 units TID Increase empagliflozin to 25 mg qd  Hypothyroidism TSH 16 in May. Levothyroixine 75 mcg started 4 weeks ago. Lab only visit on July 10th  TSH and freet4 ordered for future visit  Addendum 7/15: TSH at 9, will increase levothyroxine to 100 mcg with plan to repeat labs in 6 weeks   Patient discussed with Dr. Josetta Huddle Chantrice Hagg, D.O. [facility]  PGY-2 Pager: [phone]  Phone: [phone] Date 09/13/2022  Time 12:43 PM

## 2022-08-27 ENCOUNTER — Other Ambulatory Visit (HOSPITAL_COMMUNITY): Payer: Self-pay

## 2022-08-27 ENCOUNTER — Encounter: Payer: Self-pay | Admitting: Internal Medicine

## 2022-08-27 NOTE — Progress Notes (Signed)
Internal Medicine Clinic Attending  Case discussed with Dr. Masters  At the time of the visit.  We reviewed the resident's history and exam and pertinent patient test results.  I agree with the assessment, diagnosis, and plan of care documented in the resident's note.  

## 2022-08-27 NOTE — Assessment & Plan Note (Signed)
Lab Results  Component Value Date   HGBA1C 13.2 (A) 07/29/2022  Medications: Toujeo 42 units at bedtime, Empagliflozin 10 mg every day, Novolog 24 units TID  Since restarting medications last month she has been feeling ok. She is adherent with toujeo and empagliflozin but does not eat regular meals so typically doesn't take 3 doses of novolog. She has a glucometer but did not bring it today. She checks her blood sugar at least once a day but ids unsure of exact readings. She has follow-up with Norm Parcel on July 10th. In the past she used a CGM but had trouble getting sensors to stay on. She has previously been on GLP-1 but would not want to start again as did not achieve long term weight loss once she stopped medications. P: F/u with Lupita Leash July 19th, asked patient to bring in glucometer for download. Will message Lupita Leash to see if she could send report to me for review. Continue toujeo 42 units and novolog 24 units TID Increase empagliflozin to 25 mg qd

## 2022-08-27 NOTE — Addendum Note (Signed)
Addended by: Litha Lamartina M on: 08/27/2022 11:07 AM   Modules accepted: Orders  

## 2022-08-27 NOTE — Assessment & Plan Note (Addendum)
TSH 16 in May. Levothyroixine 75 mcg started 4 weeks ago. Lab only visit on July 10th  TSH and freet4 ordered for future visit  Addendum 7/15: TSH at 9, will increase levothyroxine to 100 mcg with plan to repeat labs in 6 weeks

## 2022-09-08 ENCOUNTER — Ambulatory Visit (INDEPENDENT_AMBULATORY_CARE_PROVIDER_SITE_OTHER): Payer: BC Managed Care – PPO | Admitting: Student

## 2022-09-08 ENCOUNTER — Other Ambulatory Visit (HOSPITAL_COMMUNITY)
Admission: RE | Admit: 2022-09-08 | Discharge: 2022-09-08 | Disposition: A | Discharge status: Home / Self Care | Payer: BC Managed Care – PPO | Source: Ambulatory Visit | Attending: Internal Medicine | Admitting: Internal Medicine

## 2022-09-08 ENCOUNTER — Other Ambulatory Visit (INDEPENDENT_AMBULATORY_CARE_PROVIDER_SITE_OTHER): Payer: BC Managed Care – PPO

## 2022-09-08 ENCOUNTER — Telehealth: Payer: Self-pay | Admitting: *Deleted

## 2022-09-08 ENCOUNTER — Ambulatory Visit: Payer: BC Managed Care – PPO | Admitting: Dietician

## 2022-09-08 VITALS — BP 146/59 | HR 70 | Temp 98.2°F | Ht 67.0 in | Wt 281.1 lb

## 2022-09-08 DIAGNOSIS — E039 Hypothyroidism, unspecified: Secondary | ICD-10-CM | POA: Diagnosis not present

## 2022-09-08 DIAGNOSIS — E118 Type 2 diabetes mellitus with unspecified complications: Secondary | ICD-10-CM | POA: Diagnosis not present

## 2022-09-08 DIAGNOSIS — Z713 Dietary counseling and surveillance: Secondary | ICD-10-CM

## 2022-09-08 DIAGNOSIS — L292 Pruritus vulvae: Secondary | ICD-10-CM | POA: Insufficient documentation

## 2022-09-08 DIAGNOSIS — R300 Dysuria: Secondary | ICD-10-CM | POA: Insufficient documentation

## 2022-09-08 DIAGNOSIS — B9689 Other specified bacterial agents as the cause of diseases classified elsewhere: Secondary | ICD-10-CM

## 2022-09-08 DIAGNOSIS — B3731 Acute candidiasis of vulva and vagina: Secondary | ICD-10-CM

## 2022-09-08 LAB — POCT URINALYSIS DIPSTICK
Bilirubin, UA: NEGATIVE
Blood, UA: NEGATIVE
Glucose, UA: POSITIVE — AB
Ketones, UA: NEGATIVE
Leukocytes, UA: NEGATIVE
Nitrite, UA: NEGATIVE
Protein, UA: NEGATIVE
Spec Grav, UA: 1.02 (ref 1.010–1.025)
Urobilinogen, UA: 0.2 E.U./dL
pH, UA: 5 (ref 5.0–8.0)

## 2022-09-08 NOTE — Telephone Encounter (Signed)
Patient her to see D. Plyler c/o itching at urination.  Thinks she may have a UTI. No c/o of a fever.  Spoke with Dr. Mayford Knife.  Patient was given an appointment with Dr. Ninfa Meeker for assessment of symptoms.

## 2022-09-08 NOTE — Patient Instructions (Addendum)
Your blood sugars are better- check your 90 day average at least weekly on your meter and aim for 150-160.   Keep insulin pens you are using out of the refrigerator and somewhere you can see them .   You want to take Novolog 15-30 minutes BEFORE eating meals.  Recommend your snacks to be lower carb-  cucumbers and hummus dip or  carrots and hummus,  low fat ranch and broccoli,  nuts and dried fruit instead   Consider addressing your sleep issues to help lower blood sugar and pressure and help you feel better.   You can try an eye doctor for glasses but with the severe eye disease at the back of your eye, it may be hard for them to make glasses to help you see better.    I recommend we follow up in about 3-4 weeks can be on the same day she sees the doctor- may want to check your insurance to see what your coverage is . Ask them about codes for Diabetes training (G0108) and  Medical Nutrition therapy (40981).   Call anytime with questions  Lupita Leash [phone]

## 2022-09-08 NOTE — Progress Notes (Signed)
Diabetes Self-Management Education  Visit Type: First/Initial  Appt. Start Time: 915 Appt. End Time: 1015  09/08/2022  Ms. Kathy Ramirez, identified by name and date of birth, is a 62 y.o. female with a diagnosis of Diabetes: Type 2.   ASSESSMENT  Would encourage lifestyle changes and adjustments in timing of insulin rather than increases to help patient retard further weight gain. It would be ideal for her to be able to use a GLP-1 but she says the entire time she was on one she had abdominal discomfort. Could try Trulicity as it has the least side effects. However, patient is also having difficulty with sleep/ sleep habits. Consider sleep study/work up.   Estimated body mass index is 44.03 kg/m as calculated from the following:   Height as of an earlier encounter on 09/08/22: 5\' 7"  (1.702 m).   Weight as of an earlier encounter on 09/08/22: 281 lb 1.6 oz (127.5 kg). Wt Readings from Last 10 Encounters:  09/08/22 281 lb 1.6 oz (127.5 kg)  08/26/22 281 lb (127.5 kg)  07/29/22 281 lb 9.6 oz (127.7 kg)  11/05/21 278 lb 6.4 oz (126.3 kg)  10/22/21 276 lb (125.2 kg)  09/23/21 275 lb 14.4 oz (125.1 kg)  03/27/21 273 lb 14.4 oz (124.2 kg)  02/09/21 277 lb 6.4 oz (125.8 kg)  01/15/21 278 lb 1.6 oz (126.1 kg)  12/10/20 271 lb 1.6 oz (123 kg)   Lab Results  Component Value Date   HGBA1C 13.2 (A) 07/29/2022   HGBA1C 6.4 (A) 09/23/2021   HGBA1C 6.9 (A) 01/15/2021   HGBA1C 12.2 (A) 10/07/2020   HGBA1C 12.3 (H) 04/07/2017    Date Range: 6/11-7/10-24  # of tests:  18  Average # tests/day:   0.6  Average (mg/dL):   161  SD (mg/dL):   09.6  Highest (mg/dL):   045  Lowest (mg/dL):   409   81% in target range, 72% above target range   Diabetes Self-Management Education - 09/08/22 1300       Visit Information   Visit Type First/Initial      Initial Visit   Diabetes Type Type 2    Date Diagnosed 2003    Are you currently following a meal plan? No   tries to limit white foods except  milk uses lactose free   Are you taking your medications as prescribed? Yes   novolog 1x/d or 2x/day or 3x?day depenidng on meals eaten, snacks a lot at night( chips, candy and cookies while wathcing TV and using computer)  and does not take insulin for this     Health Coping   How would you rate your overall health? Fair      Psychosocial Assessment   Patient Belief/Attitude about Diabetes Motivated to manage diabetes    What is the hardest part about your diabetes right now, causing you the most concern, or is the most worrisome to you about your diabetes?   Being active    Self-care barriers Impaired vision;Debilitated state due to current medical condition;Lack of material resources;Unsteady gait/risk for falls    Self-management support Family    Other persons present Spouse/SO    Patient Concerns Glycemic Control    Special Needs Large print    Preferred Learning Style No preference indicated    Learning Readiness Contemplating   was not sure she was ready to make changes today   How often do you need to have someone help you when you read instructions, pamphlets, or other written  materials from your doctor or pharmacy? 3 - Sometimes    What is the last grade level you completed in school? 12      Pre-Education Assessment   Patient understands the diabetes disease and treatment process. Comprehends key points    Patient understands incorporating nutritional management into lifestyle. Comprehends key points   has some misconceptations   Patient undertands incorporating physical activity into lifestyle. Needs Review    Patient understands using medications safely. Needs Review   takes Novolog beofre or after meals, is keeping all insulin in firg   Patient understands monitoring blood glucose, interpreting and using results Needs Review   did not seem interested in CGM today   Patient understands prevention, detection, and treatment of acute complications. Comprehends key points     Patient understands prevention, detection, and treatment of chronic complications. Compreheands key points    Patient understands how to develop strategies to address psychosocial issues. Demonstrates understanding / competency    Patient understands how to develop strategies to promote health/change behavior. Needs Review      Complications   Last HgB A1C per patient/outside source 13.2 %    How often do you check your blood sugar? 1-2 times/day    Fasting Blood glucose range (mg/dL) 40-981;191-478    Postprandial Blood glucose range (mg/dL) >295;621-308    Number of hypoglycemic episodes per month 0    Number of hyperglycemic episodes ( >200mg /dL): Daily    Can you tell when your blood sugar is high? No    Have you had a dilated eye exam in the past 12 months? No    Have you had a dental exam in the past 12 months? No    Are you checking your feet? No      Dietary Intake   Breakfast does not eat a smae times or specific times, sometimes doe snot eat all day and eats cereal when spouse gets home from work 730 PM    Dinner enjoys vegetables but has been too hot to [employer], so spouse stops and brings food home    Snack (evening) 10 Pm to 3 am chips, candya dn cookies and diet soda    Beverage(s) water, diet drinks      Activity / Exercise   Activity / Exercise Type ADL's   limited by pain in feet and back/hip in wheel chair today     Patient Education   Previous Diabetes Education Yes (please comment)   whem diagnosed   Healthy Eating Meal options for control of blood glucose level and chronic complications.;Reviewed blood glucose goals for pre and post meals and how to evaluate the patients' food intake on their blood glucose level.    Medications Reviewed patients medication for diabetes, action, purpose, timing of dose and side effects.    Monitoring Identified appropriate SMBG and/or A1C goals.;Purpose and frequency of SMBG.    Lifestyle and Health Coping Helped patient develop diabetes  management plan for (enter comment);Lifestyle issues that need to be addressed for better diabetes care   evaluating her progress for glycemic control using meter     Individualized Goals (developed by patient)   Medications take my medication as prescribed    Monitoring  Other (comment)   check average in meter weekly     Post-Education Assessment   Patient understands using medications safely. Comphrehends key points    Patient understands monitoring blood glucose, interpreting and using results Comprehends key points    Patient understands how to develop  strategies to promote health/change behavior. Comprehends key points      Outcomes   Expected Outcomes Demonstrated interest in learning but significant barriers to change    Future DMSE 4-6 wks    Program Status Not Completed             Individualized Plan for Diabetes Self-Management Training:   Learning Objective:  Patient will have a greater understanding of diabetes self-management. Patient education plan is to attend individual and/or group sessions per assessed needs and concerns.   Plan:   Patient Instructions  Your blood sugars are better- check your 90 day average at least weekly on your meter and aim for 150-160.   Keep insulin pens you are using out of the refrigerator and somewhere you can see them .   You want to take Novolog 15-30 minutes BEFORE eating meals.  Recommend your snacks to be lower carb-  cucumbers and hummus dip or  carrots and hummus,  low fat ranch and broccoli,  nuts and dried fruit instead   Consider addressing your sleep issues to help lower blood sugar and pressure and help you feel better.   You can try an eye doctor for glasses but with the severe eye disease at the back of your eye, it may be hard for them to make glasses to help you see better.    I recommend we follow up in about 3-4 weeks can be on the same day she sees the doctor- may want to check your insurance to see what  your coverage is . Ask them about codes for Diabetes training (G0108) and  Medical Nutrition therapy (16109).   Call anytime with questions  Lupita Leash [phone]   Expected Outcomes:  Demonstrated interest in learning but significant barriers to change  Education material provided: Diabetes Resources  If problems or questions, patient to contact team via:  Phone  Future DSME appointment: 4-6 wks Norm Parcel, RD 09/08/2022 1:42 PM.

## 2022-09-08 NOTE — Patient Instructions (Signed)
Ms Groseclose  Thank you for coming in today. This after visit summary is an important review of tests, referrals, and medication changes that were discussed during your visit. If you have questions or concerns, call the clinic at [phone] between 9am - 4pm. Outside of clinic business hours, call the main hospital at [phone] and ask the operator for the on-call internal medicine resident.   Best, Dr. Katheran James

## 2022-09-08 NOTE — Progress Notes (Addendum)
CC: Vulvovaginal itching  HPI:  [name] is a 62 y.o. female living with a history stated below and presents today for evaluation of vulvovaginal itching for 2 weeks. Denies any other symptoms including dysuria, discharge, fever. Please see problem based assessment and plan for additional details.  Past Medical History:  Diagnosis Date   Depression    Diabetes mellitus with complication (HCC)    Diabetic retinopathy (HCC)    Thyroid disease     Current Outpatient Medications on File Prior to Visit  Medication Sig Dispense Refill   busPIRone (BUSPAR) 5 MG tablet Take 5 mg by mouth 3 (three) times daily.     DULoxetine (CYMBALTA) 30 MG capsule Take 90 mg by mouth daily.     empagliflozin (JARDIANCE) 25 MG TABS tablet Take 1 tablet (25 mg total) by mouth daily before breakfast. 90 tablet 3   gabapentin (NEURONTIN) 300 MG capsule Take 1 capsule (300 mg total) by mouth 2 (two) times daily AND 2 capsules (600 mg total) at bedtime. 120 capsule 2   Insulin Pen Needle (PEN NEEDLES) 32G X 4 MM MISC Use with novolog 3 times daily and 1 time daily with trojeo 100 each 2   ipratropium (ATROVENT) 0.06 % nasal spray Place 2 sprays into both nostrils 4 (four) times daily. 15 mL 0   levothyroxine (SYNTHROID) 75 MCG tablet Take 1 tablet (75 mcg total) by mouth daily. 30 tablet 11   NOVOLOG FLEXPEN 100 UNIT/ML FlexPen Inject 25 Units into the skin 3 (three) times daily with meals. 33 mL 0   TOUJEO SOLOSTAR 300 UNIT/ML Solostar Pen INJECT 42 UNITS SUBCUTANEOUSLY ONCE DAILY 9 mL 0   No current facility-administered medications on file prior to visit.    Family History  Problem Relation Age of Onset   Diabetes Mother    Alzheimer's disease Mother    Lung cancer Mother    Liver cancer Son     Social History   Socioeconomic History   Marital status: Married    Spouse name: Not on file   Number of children: Not on file   Years of education: Not on file   Highest education level: Not on  file  Occupational History   Not on file  Tobacco Use   Smoking status: Former    Packs/day: 1.00    Years: 30.00    Additional pack years: 0.00    Total pack years: 30.00    Types: Cigarettes    Quit date: 2012    Years since quitting: 12.5   Smokeless tobacco: Never   Tobacco comments:    quit 2012  Vaping Use   Vaping Use: Never used  Substance and Sexual Activity   Alcohol use: Yes    Comment: Drinks a couple ounces of rum most days of the week   Drug use: Never   Sexual activity: Not on file  Other Topics Concern   Not on file  Social History Narrative   Not on file   Social Determinants of Health   Financial Resource Strain: Not on file  Food Insecurity: Not on file  Transportation Needs: Not on file  Physical Activity: Not on file  Stress: Not on file  Social Connections: Not on file  Intimate Partner Violence: Not on file    Review of Systems: ROS negative except for what is noted on the assessment and plan.  Vitals:   09/08/22 1019 09/08/22 1102  BP: (!) 161/68 (!) 146/59  Pulse: 72  70  Temp: 98.2 F (36.8 C)   TempSrc: Oral   SpO2: 100%   Weight: 281 lb 1.6 oz (127.5 kg)   Height: 5\' 7"  (1.702 m)     Physical Exam: Constitutional: well-appearing female sitting in chair, in no acute distress HENT: normocephalic atraumatic, mucous membranes moist Eyes: conjunctiva non-erythematous Cardiovascular: regular rate and rhythm, no m/r/g Pulmonary/Chest: normal work of breathing on room air, lungs clear to auscultation bilaterally Abdominal: soft, non-tender, non-distended MSK: normal bulk and tone Neurological: alert & oriented x 3, no focal deficit Skin: warm and dry Psych: normal mood and behavior  Assessment & Plan:    Patient seen with Dr. Mayford Knife  Vulvovaginal itching 2 Weeks of increased itchiness. Pt denies other symptoms including dysuria, frequency, suprapubic pain, discharge, fever, hematuria, vaginal dryness. UA with glucose but  otherwise negative for UTI. Self swab performed to consider alternative infectious causes such as candida, patient did recently increase her dose of Jardiance. Dryness alone could be dryness/atrophy in the post-menopausal patient, though she has not noticed the itchiness long-term. PLAN: await results of swab, treat for yeast infection if indicated. Otherwise consider pelvic exam for above alternatives.  At next visit, will reevaluate these symptoms as well as recheck thyroid function (re labs drawn today) and diabetic control.  Katheran James, D.O. [facility] Medicine, PGY-1 Phone: [phone] Date 09/08/2022 Time 1:13 PM

## 2022-09-08 NOTE — Assessment & Plan Note (Addendum)
2 Weeks of increased itchiness. Pt denies other symptoms including dysuria, frequency, suprapubic pain, discharge, fever, hematuria, vaginal dryness. UA with glucose but otherwise negative for UTI. Self swab performed to consider alternative infectious causes such as candida, patient did recently increase her dose of Jardiance. Dryness alone could be dryness/atrophy in the post-menopausal patient, though she has not noticed the itchiness long-term. PLAN: await results of swab, treat for yeast infection if indicated. Otherwise consider pelvic exam for above alternatives.

## 2022-09-09 ENCOUNTER — Other Ambulatory Visit (HOSPITAL_COMMUNITY): Payer: Self-pay

## 2022-09-09 LAB — CERVICOVAGINAL ANCILLARY ONLY
Bacterial Vaginitis (gardnerella): POSITIVE — AB
Candida Glabrata: POSITIVE — AB
Candida Vaginitis: POSITIVE — AB
Comment: NEGATIVE
Comment: NEGATIVE
Comment: NEGATIVE
Comment: NEGATIVE
Trichomonas: NEGATIVE

## 2022-09-09 MED ORDER — METRONIDAZOLE 500 MG PO TABS
500.0000 mg | ORAL_TABLET | Freq: Two times a day (BID) | ORAL | 0 refills | Status: AC
Start: 2022-09-09 — End: 2022-09-17
  Filled 2022-09-09: qty 14, 7d supply, fill #0

## 2022-09-09 MED ORDER — FLUCONAZOLE 150 MG PO TABS
150.0000 mg | ORAL_TABLET | Freq: Every day | ORAL | 0 refills | Status: AC
Start: 2022-09-09 — End: 2022-09-11
  Filled 2022-09-09: qty 1, 1d supply, fill #0

## 2022-09-09 NOTE — Addendum Note (Signed)
Addended by: Katheran James on: 09/09/2022 01:56 PM   Modules accepted: Orders

## 2022-09-09 NOTE — Progress Notes (Signed)
Internal Medicine Clinic Attending  I was physically present during the key portions of the resident provided service and participated in the medical decision making of patient's management care. I reviewed pertinent patient test results.  The assessment, diagnosis, and plan were formulated together and I agree with the documentation in the resident's note.  Ernesha Ramone Anne, MD  

## 2022-09-10 ENCOUNTER — Other Ambulatory Visit (HOSPITAL_COMMUNITY): Payer: Self-pay

## 2022-09-10 LAB — TSH: TSH: 9.07 u[IU]/mL — ABNORMAL HIGH (ref 0.450–4.500)

## 2022-09-10 LAB — T4, FREE: Free T4: 1.22 ng/dL (ref 0.82–1.77)

## 2022-09-13 ENCOUNTER — Other Ambulatory Visit (HOSPITAL_COMMUNITY): Payer: Self-pay

## 2022-09-13 MED ORDER — LEVOTHYROXINE SODIUM 100 MCG PO TABS
100.0000 ug | ORAL_TABLET | Freq: Every day | ORAL | 11 refills | Status: DC
Start: 2022-09-13 — End: 2023-07-28
  Filled 2022-09-13: qty 30, 30d supply, fill #0
  Filled 2022-10-10: qty 30, 30d supply, fill #1
  Filled 2022-12-01: qty 30, 30d supply, fill #2
  Filled 2023-01-06: qty 30, 30d supply, fill #3
  Filled 2023-02-25: qty 30, 30d supply, fill #4
  Filled 2023-07-28: qty 30, 30d supply, fill #5

## 2022-09-13 NOTE — Addendum Note (Signed)
Addended by: Lucille Passy on: 09/13/2022 12:43 PM   Modules accepted: Orders

## 2022-09-29 ENCOUNTER — Encounter: Payer: BC Managed Care – PPO | Admitting: Student

## 2022-10-11 ENCOUNTER — Other Ambulatory Visit: Payer: Self-pay

## 2022-10-14 ENCOUNTER — Other Ambulatory Visit: Payer: Self-pay | Admitting: Student

## 2022-10-14 DIAGNOSIS — M545 Low back pain, unspecified: Secondary | ICD-10-CM

## 2022-10-15 ENCOUNTER — Other Ambulatory Visit (HOSPITAL_COMMUNITY): Payer: Self-pay

## 2022-10-15 MED ORDER — DULOXETINE HCL 30 MG PO CPEP
90.0000 mg | ORAL_CAPSULE | Freq: Every day | ORAL | 3 refills | Status: DC
  Filled 2022-10-15 – 2022-10-27 (×4): qty 90, 30d supply, fill #0
  Filled 2022-12-01: qty 90, 30d supply, fill #1
  Filled 2023-01-06: qty 90, 30d supply, fill #2
  Filled 2023-02-25: qty 90, 30d supply, fill #3

## 2022-10-15 MED ORDER — DULOXETINE HCL 30 MG PO CPEP
90.0000 mg | ORAL_CAPSULE | Freq: Every day | ORAL | 3 refills | Status: DC
Start: 2022-10-15 — End: 2023-07-28
  Filled 2022-10-15: qty 90, 30d supply, fill #0
  Filled 2022-10-25 (×2): qty 30, 10d supply, fill #0

## 2022-10-25 ENCOUNTER — Other Ambulatory Visit (HOSPITAL_COMMUNITY): Payer: Self-pay

## 2022-10-25 ENCOUNTER — Other Ambulatory Visit: Payer: Self-pay | Admitting: Internal Medicine

## 2022-10-25 DIAGNOSIS — E118 Type 2 diabetes mellitus with unspecified complications: Secondary | ICD-10-CM

## 2022-10-25 DIAGNOSIS — F411 Generalized anxiety disorder: Secondary | ICD-10-CM

## 2022-10-26 ENCOUNTER — Other Ambulatory Visit (HOSPITAL_COMMUNITY): Payer: Self-pay

## 2022-10-27 ENCOUNTER — Other Ambulatory Visit (HOSPITAL_COMMUNITY): Payer: Self-pay

## 2022-10-27 NOTE — Telephone Encounter (Signed)
Call to [facility] patient was unable to get prescription for her Duloxetine filled.  Was denied as being to early.  Ca;l to patient to see where she had gotten the last refill  at the [facility].  Call to [facility] last refill was 7/20.2024 and patient is not to early. Call t insurance company [employer].  Stated last refill was done on 10/15/2022 at [facility]  Informed Insurance company that a denial was was given on that date.  Insurance would like for the Pharmacy to give them a call to reverse the decision.  [facility] was called and will call the [employer]. Call to patient asked her t call the [facility] to see if she can pick up her medication.

## 2022-10-28 ENCOUNTER — Other Ambulatory Visit (HOSPITAL_COMMUNITY): Payer: Self-pay

## 2022-10-28 MED ORDER — NOVOLOG FLEXPEN 100 UNIT/ML ~~LOC~~ SOPN
25.0000 [IU] | PEN_INJECTOR | Freq: Three times a day (TID) | SUBCUTANEOUS | 0 refills | Status: DC
  Filled 2022-10-28: qty 33, 44d supply, fill #0

## 2022-10-28 MED ORDER — TOUJEO SOLOSTAR 300 UNIT/ML ~~LOC~~ SOPN
PEN_INJECTOR | SUBCUTANEOUS | 0 refills | Status: DC
Start: 2022-10-28 — End: 2023-01-06
  Filled 2022-10-28: qty 9, 64d supply, fill #0

## 2022-10-29 ENCOUNTER — Other Ambulatory Visit (HOSPITAL_COMMUNITY): Payer: Self-pay

## 2022-12-01 ENCOUNTER — Other Ambulatory Visit (HOSPITAL_COMMUNITY): Payer: Self-pay

## 2022-12-01 MED ORDER — GABAPENTIN 300 MG PO CAPS
1200.0000 mg | ORAL_CAPSULE | Freq: Four times a day (QID) | ORAL | 1 refills | Status: DC
Start: 2022-07-29 — End: 2023-07-28
  Filled 2022-12-01: qty 120, 30d supply, fill #0
  Filled 2023-01-06: qty 120, 30d supply, fill #1

## 2022-12-02 ENCOUNTER — Other Ambulatory Visit (HOSPITAL_COMMUNITY): Payer: Self-pay

## 2022-12-06 ENCOUNTER — Other Ambulatory Visit (HOSPITAL_COMMUNITY): Payer: Self-pay

## 2022-12-27 ENCOUNTER — Other Ambulatory Visit: Payer: Self-pay | Admitting: Student

## 2022-12-27 DIAGNOSIS — E118 Type 2 diabetes mellitus with unspecified complications: Secondary | ICD-10-CM

## 2022-12-30 ENCOUNTER — Other Ambulatory Visit (HOSPITAL_COMMUNITY): Payer: Self-pay

## 2022-12-30 MED ORDER — NOVOLOG FLEXPEN 100 UNIT/ML ~~LOC~~ SOPN
25.0000 [IU] | PEN_INJECTOR | Freq: Three times a day (TID) | SUBCUTANEOUS | 0 refills | Status: DC
  Filled 2022-12-30: qty 33, 44d supply, fill #0

## 2023-01-06 ENCOUNTER — Other Ambulatory Visit: Payer: Self-pay | Admitting: Student

## 2023-01-06 ENCOUNTER — Other Ambulatory Visit: Payer: Self-pay

## 2023-01-06 DIAGNOSIS — F418 Other specified anxiety disorders: Secondary | ICD-10-CM

## 2023-01-06 DIAGNOSIS — E118 Type 2 diabetes mellitus with unspecified complications: Secondary | ICD-10-CM

## 2023-01-07 MED ORDER — TOUJEO SOLOSTAR 300 UNIT/ML ~~LOC~~ SOPN
42.0000 [IU] | PEN_INJECTOR | Freq: Every day | SUBCUTANEOUS | 0 refills | Status: DC
Start: 2023-01-07 — End: 2023-08-03
  Filled 2023-01-07: qty 9, 64d supply, fill #0

## 2023-01-07 MED ORDER — BUSPIRONE HCL 5 MG PO TABS
5.0000 mg | ORAL_TABLET | Freq: Three times a day (TID) | ORAL | 2 refills | Status: DC
  Filled 2023-01-07: qty 90, 30d supply, fill #0
  Filled 2023-02-25: qty 90, 30d supply, fill #1
  Filled 2023-09-01: qty 90, 30d supply, fill #2

## 2023-01-10 ENCOUNTER — Other Ambulatory Visit (HOSPITAL_COMMUNITY): Payer: Self-pay

## 2023-02-25 ENCOUNTER — Other Ambulatory Visit: Payer: Self-pay | Admitting: Student

## 2023-02-25 ENCOUNTER — Other Ambulatory Visit (HOSPITAL_COMMUNITY): Payer: Self-pay

## 2023-02-25 ENCOUNTER — Other Ambulatory Visit: Payer: Self-pay

## 2023-02-25 DIAGNOSIS — E118 Type 2 diabetes mellitus with unspecified complications: Secondary | ICD-10-CM

## 2023-02-28 ENCOUNTER — Other Ambulatory Visit (HOSPITAL_COMMUNITY): Payer: Self-pay

## 2023-02-28 MED ORDER — NOVOLOG FLEXPEN 100 UNIT/ML ~~LOC~~ SOPN
25.0000 [IU] | PEN_INJECTOR | Freq: Three times a day (TID) | SUBCUTANEOUS | 3 refills | Status: DC
  Filled 2023-02-28: qty 24, 30d supply, fill #0

## 2023-02-28 MED FILL — Gabapentin Cap 300 MG: ORAL | 30 days supply | Qty: 120 | Fill #0 | Status: AC

## 2023-03-09 ENCOUNTER — Other Ambulatory Visit (HOSPITAL_COMMUNITY): Payer: Self-pay

## 2023-04-21 ENCOUNTER — Telehealth: Payer: Self-pay | Admitting: *Deleted

## 2023-04-21 NOTE — Telephone Encounter (Signed)
 Patient was identified as falling into the [place] Measure - Diabetes.   Patient was: Appointment scheduled with primary care provider in the next 30 days.

## 2023-05-05 ENCOUNTER — Encounter: Payer: BC Managed Care – PPO | Admitting: Student

## 2023-05-12 ENCOUNTER — Encounter: Admitting: Student

## 2023-05-18 ENCOUNTER — Telehealth: Payer: Self-pay

## 2023-05-18 NOTE — Telephone Encounter (Signed)
 Patient is due for a mammogram I called to get her scheduled, unable to reach her, mailbox is full.

## 2023-06-01 ENCOUNTER — Telehealth: Payer: Self-pay | Admitting: *Deleted

## 2023-06-01 NOTE — Telephone Encounter (Signed)
 Spoke with patient regarding scheduling her mammogram / patient states she is not wanting to have a mammogram done at this time. Stated she have asked to have mammogram taken off of her chart, will let her PCP know this information.

## 2023-07-28 ENCOUNTER — Other Ambulatory Visit: Payer: Self-pay

## 2023-07-28 ENCOUNTER — Other Ambulatory Visit (HOSPITAL_COMMUNITY): Payer: Self-pay

## 2023-07-28 ENCOUNTER — Other Ambulatory Visit: Payer: Self-pay | Admitting: Student

## 2023-07-28 ENCOUNTER — Encounter (HOSPITAL_COMMUNITY): Payer: Self-pay

## 2023-07-28 DIAGNOSIS — E039 Hypothyroidism, unspecified: Secondary | ICD-10-CM

## 2023-07-28 DIAGNOSIS — E118 Type 2 diabetes mellitus with unspecified complications: Secondary | ICD-10-CM

## 2023-07-28 DIAGNOSIS — F418 Other specified anxiety disorders: Secondary | ICD-10-CM

## 2023-07-28 MED ORDER — GABAPENTIN 300 MG PO CAPS
ORAL_CAPSULE | ORAL | 2 refills | Status: DC
  Filled 2023-07-28: qty 120, 30d supply, fill #0
  Filled 2023-08-30: qty 120, 30d supply, fill #1
  Filled 2023-10-11: qty 120, 30d supply, fill #2

## 2023-07-28 MED ORDER — LEVOTHYROXINE SODIUM 100 MCG PO TABS
100.0000 ug | ORAL_TABLET | Freq: Every day | ORAL | 11 refills | Status: DC
  Filled 2023-07-28: qty 90, 90d supply, fill #0

## 2023-07-28 MED ORDER — BUSPIRONE HCL 7.5 MG PO TABS: 7.5000 mg | ORAL_TABLET | Freq: Two times a day (BID) | ORAL | 2 refills | Status: DC

## 2023-07-28 MED ORDER — EMPAGLIFLOZIN 25 MG PO TABS
25.0000 mg | ORAL_TABLET | Freq: Every day | ORAL | 3 refills | Status: DC
Start: 2023-07-28 — End: 2023-08-03
  Filled 2023-07-28: qty 90, 90d supply, fill #0

## 2023-07-28 MED FILL — Duloxetine HCl Enteric Coated Pellets Cap 60 MG (Base Eq): ORAL | 90 days supply | Qty: 90 | Fill #0 | Status: AC

## 2023-07-28 NOTE — Telephone Encounter (Signed)
 Duloxetine  decreased to 60 mg daily.  Okay to take Jardiance  25 mg.  Will refill buspirone  as she was getting benefit from this before.  She needs an appointment in the clinic.

## 2023-07-28 NOTE — Telephone Encounter (Addendum)
 Patient is requesting a 90 day supply on her medications.  Patient is also requesting a rx for Buspirone , medication has expired off of her med list  Patient stated she has been off of Jardiance  due to the cost she couldn't find a coupon. Patient wants to know if she should start at the lower dose (10 mg) or high dose (25 mg). Please return patients call.

## 2023-08-01 ENCOUNTER — Other Ambulatory Visit (HOSPITAL_COMMUNITY): Payer: Self-pay

## 2023-08-03 ENCOUNTER — Telehealth: Payer: Self-pay

## 2023-08-03 ENCOUNTER — Other Ambulatory Visit: Payer: Self-pay

## 2023-08-03 ENCOUNTER — Encounter: Payer: Self-pay | Admitting: Internal Medicine

## 2023-08-03 ENCOUNTER — Ambulatory Visit: Admitting: Internal Medicine

## 2023-08-03 VITALS — BP 144/52 | HR 66 | Temp 98.5°F | Ht 67.0 in | Wt 283.2 lb

## 2023-08-03 DIAGNOSIS — E559 Vitamin D deficiency, unspecified: Secondary | ICD-10-CM

## 2023-08-03 DIAGNOSIS — E039 Hypothyroidism, unspecified: Secondary | ICD-10-CM | POA: Diagnosis not present

## 2023-08-03 DIAGNOSIS — Z7984 Long term (current) use of oral hypoglycemic drugs: Secondary | ICD-10-CM

## 2023-08-03 DIAGNOSIS — I10 Essential (primary) hypertension: Secondary | ICD-10-CM

## 2023-08-03 DIAGNOSIS — E1169 Type 2 diabetes mellitus with other specified complication: Secondary | ICD-10-CM

## 2023-08-03 DIAGNOSIS — E118 Type 2 diabetes mellitus with unspecified complications: Secondary | ICD-10-CM | POA: Diagnosis not present

## 2023-08-03 DIAGNOSIS — Z0000 Encounter for general adult medical examination without abnormal findings: Secondary | ICD-10-CM

## 2023-08-03 DIAGNOSIS — E785 Hyperlipidemia, unspecified: Secondary | ICD-10-CM | POA: Diagnosis not present

## 2023-08-03 DIAGNOSIS — Z794 Long term (current) use of insulin: Secondary | ICD-10-CM

## 2023-08-03 DIAGNOSIS — R252 Cramp and spasm: Secondary | ICD-10-CM

## 2023-08-03 DIAGNOSIS — F418 Other specified anxiety disorders: Secondary | ICD-10-CM

## 2023-08-03 LAB — POCT GLYCOSYLATED HEMOGLOBIN (HGB A1C): Hemoglobin A1C: 14 % — AB (ref 4.0–5.6)

## 2023-08-03 LAB — GLUCOSE, CAPILLARY: Glucose-Capillary: 350 mg/dL — ABNORMAL HIGH (ref 70–99)

## 2023-08-03 MED ORDER — TOUJEO SOLOSTAR 300 UNIT/ML ~~LOC~~ SOPN
42.0000 [IU] | PEN_INJECTOR | Freq: Every day | SUBCUTANEOUS | 0 refills | Status: DC
  Filled 2023-08-03 – 2023-11-16 (×3): qty 9, 64d supply, fill #0

## 2023-08-03 MED ORDER — LEVOTHYROXINE SODIUM 100 MCG PO TABS
100.0000 ug | ORAL_TABLET | Freq: Every day | ORAL | 11 refills | Status: AC
  Filled 2023-08-03 – 2023-10-30 (×2): qty 30, 30d supply, fill #0
  Filled 2023-12-07: qty 30, 30d supply, fill #1
  Filled 2024-01-24: qty 30, 30d supply, fill #2
  Filled 2024-02-24: qty 30, 30d supply, fill #3
  Filled 2024-02-27: qty 30, 30d supply, fill #0
  Filled 2024-04-02: qty 30, 30d supply, fill #1

## 2023-08-03 MED ORDER — ROSUVASTATIN CALCIUM 20 MG PO TABS
20.0000 mg | ORAL_TABLET | Freq: Every day | ORAL | 11 refills | Status: AC
  Filled 2023-08-03: qty 30, 30d supply, fill #0
  Filled 2023-09-05: qty 30, 30d supply, fill #1
  Filled 2023-10-11: qty 30, 30d supply, fill #2
  Filled 2023-11-16: qty 30, 30d supply, fill #3
  Filled 2023-12-26: qty 30, 30d supply, fill #4
  Filled 2024-01-24: qty 30, 30d supply, fill #5
  Filled 2024-02-24: qty 30, 30d supply, fill #6
  Filled 2024-03-27: qty 30, 30d supply, fill #7

## 2023-08-03 MED ORDER — BUSPIRONE HCL 7.5 MG PO TABS
7.5000 mg | ORAL_TABLET | Freq: Two times a day (BID) | ORAL | 2 refills | Status: DC
  Filled 2023-08-03 – 2023-09-23 (×5): qty 60, 30d supply, fill #0
  Filled 2023-10-30: qty 60, 30d supply, fill #1
  Filled 2023-12-07: qty 60, 30d supply, fill #2

## 2023-08-03 MED ORDER — NOVOLOG FLEXPEN 100 UNIT/ML ~~LOC~~ SOPN
15.0000 [IU] | PEN_INJECTOR | Freq: Three times a day (TID) | SUBCUTANEOUS | 3 refills | Status: AC
  Filled 2023-08-03: qty 33, 73d supply, fill #0
  Filled 2023-08-04: qty 33, 30d supply, fill #0
  Filled 2023-10-30: qty 33, 73d supply, fill #0
  Filled 2024-03-27: qty 33, 73d supply, fill #1

## 2023-08-03 MED ORDER — MOUNJARO 2.5 MG/0.5ML ~~LOC~~ SOAJ
2.5000 mg | SUBCUTANEOUS | 0 refills | Status: DC
  Filled 2023-08-03 – 2023-08-15 (×3): qty 2, 28d supply, fill #0

## 2023-08-03 MED ORDER — EMPAGLIFLOZIN 10 MG PO TABS
10.0000 mg | ORAL_TABLET | Freq: Every day | ORAL | 2 refills | Status: DC
  Filled 2023-08-03 – 2023-08-04 (×3): qty 30, 30d supply, fill #0
  Filled 2023-09-05: qty 30, 30d supply, fill #1
  Filled 2023-10-11 – 2023-10-28 (×5): qty 30, 30d supply, fill #2

## 2023-08-03 NOTE — Telephone Encounter (Signed)
 Kathy Ramirez (Key: [id number]) Rx #: [id number] Mounjaro  2.5MG /0.5ML auto-injectors Form [employer] [place] [employer] Electronic Request Form Created Sent to Plan Plan Response Submit Clinical Questions Determination Favorable Message from Plan Approved. Please note: The allowed amount is 2 mL (4 pens) per 180 days. This medication is to be increased in strength every 30 days, as tolerated by the member, to a maintenance dose of up to 15mg /0.26mL (4 pens per 28 days). The program limit of 4 pens per 180 days of the 2.5mg /0.28mL strength allows for this dose increase.. Authorization Expiration Date: August 02, 2024.

## 2023-08-03 NOTE — Patient Instructions (Addendum)
 Kathy Ramirez, it was a pleasure seeing you today! You endorsed feeling well today. Below are some of the things we talked about this visit. We look forward to seeing you in the follow up appointment!  Today we discussed: Please start taking the following medications regularly.   You need close follow up so come back next week.   We will get lab work today and I will call you with the results.     I have ordered the following labs today:   Lab Orders         Glucose, capillary         BMP8+Anion Gap         Lipid Profile         Microalbumin / Creatinine Urine Ratio         TSH         T4, Free         Vitamin D (25 hydroxy)         Magnesium         POC Hbg A1C       Referrals ordered today:   Referral Orders  No referral(s) requested today     I have ordered the following medication/changed the following medications:   Stop the following medications: Medications Discontinued During This Encounter  Medication Reason   TOUJEO  SOLOSTAR 300 UNIT/ML Solostar Pen Reorder   NOVOLOG  FLEXPEN 100 UNIT/ML FlexPen Reorder   empagliflozin  (JARDIANCE ) 25 MG TABS tablet Reorder   levothyroxine  (SYNTHROID ) 100 MCG tablet Reorder   busPIRone  (BUSPAR ) 7.5 MG tablet Reorder     Start the following medications: Meds ordered this encounter  Medications   busPIRone  (BUSPAR ) 7.5 MG tablet    Sig: Take 1 tablet (7.5 mg total) by mouth 2 (two) times daily.    Dispense:  60 tablet    Refill:  2   empagliflozin  (JARDIANCE ) 10 MG TABS tablet    Sig: Take 1 tablet (10 mg total) by mouth daily before breakfast.    Dispense:  30 tablet    Refill:  2   NOVOLOG  FLEXPEN 100 UNIT/ML FlexPen    Sig: Inject 15 Units into the skin 3 (three) times daily with meals.    Dispense:  33 mL    Refill:  3   TOUJEO  SOLOSTAR 300 UNIT/ML Solostar Pen    Sig: Inject 42 Units into the skin daily.    Dispense:  9 mL    Refill:  0   levothyroxine  (SYNTHROID ) 100 MCG tablet    Sig: Take 1 tablet (100  mcg total) by mouth daily.    Dispense:  30 tablet    Refill:  11   tirzepatide  (MOUNJARO ) 2.5 MG/0.5ML Pen    Sig: Inject 2.5 mg into the skin once a week for 28 days.    Dispense:  2 mL    Refill:  0     Follow-up: 1 week follow up   Please make sure to arrive 15 minutes prior to your next appointment. If you arrive late, you may be asked to reschedule.   We look forward to seeing you next time. Please call our clinic at [phone] if you have any questions or concerns. The best time to call is Monday-Friday from 9am-4pm, but there is someone available 24/7. If after hours or the weekend, call the main hospital number and ask for the Internal Medicine Resident On-Call. If you need medication refills, please notify your pharmacy one week in advance and  they will send us  a request.  Thank you for letting us  take part in your care. Wishing you the best!  Thank you, Jackolyn Masker, MD

## 2023-08-03 NOTE — Telephone Encounter (Signed)
 Prior Authorization for patient (Mounjaro  2.5MG /0.5ML auto-injectors) came through on cover my meds was submitted with last office notes and labs awaiting approval or denial.  ZOX:[id number]

## 2023-08-03 NOTE — Progress Notes (Unsigned)
 CC: follow up visit  HPI:  Kathy Ramirez is a 63 y.o. with medical history of DMII, HLD, MDD, hypothyroidism presenting to [facility] for a follow up visit. Last visit was in 08/2022 for an acute visit.   Please see problem-based list for further details, assessments, and plans.  Past Medical History:  Diagnosis Date   Depression    Diabetes mellitus with complication (HCC)    Diabetic retinopathy (HCC)    Thyroid  disease     Current Outpatient Medications (Endocrine & Metabolic):    tirzepatide  (MOUNJARO ) 2.5 MG/0.5ML Pen, Inject 2.5 mg into the skin once a week for 28 days.   empagliflozin  (JARDIANCE ) 10 MG TABS tablet, Take 1 tablet (10 mg total) by mouth daily before breakfast.   levothyroxine  (SYNTHROID ) 100 MCG tablet, Take 1 tablet (100 mcg total) by mouth daily.   NOVOLOG  FLEXPEN 100 UNIT/ML FlexPen, Inject 15 Units into the skin 3 (three) times daily with meals.   TOUJEO  SOLOSTAR 300 UNIT/ML Solostar Pen, Inject 42 Units into the skin daily.  Current Outpatient Medications (Cardiovascular):    rosuvastatin (CRESTOR) 20 MG tablet, Take 1 tablet (20 mg total) by mouth daily.  Current Outpatient Medications (Respiratory):    ipratropium (ATROVENT ) 0.06 % nasal spray, Place 2 sprays into both nostrils 4 (four) times daily.    Current Outpatient Medications (Other):    busPIRone  (BUSPAR ) 7.5 MG tablet, Take 1 tablet (7.5 mg total) by mouth 2 (two) times daily.   DULoxetine  (CYMBALTA ) 60 MG capsule, Take 1 capsule (60 mg total) by mouth daily.   gabapentin  (NEURONTIN ) 300 MG capsule, Take 1 capsule (300 mg total) by mouth 2 (two) times daily AND 2 capsules (600 mg total) at bedtime.   Insulin  Pen Needle (PEN NEEDLES) 32G X 4 MM MISC, Use with novolog  3 times daily and 1 time daily with trojeo  Review of Systems:  Review of system negative unless stated in the problem list or HPI.    Physical Exam:  Vitals:   08/03/23 1050 08/03/23 1102  BP: (!) 134/46 (!) 144/52  Pulse:  71 66  Temp: 98.5 F (36.9 C)   TempSrc: Oral   SpO2: 99%   Weight: 283 lb 3.2 oz (128.5 kg)   Height: 5\' 7"  (1.702 m)    Physical Exam General: NAD HENT: NCAT Lungs: CTAB, no wheeze, rhonchi or rales.  Cardiovascular: Normal heart sounds, no r/m/g, 2+ pulses in all extremities. No LE edema Abdomen: No TTP, normal bowel sounds MSK: No asymmetry or muscle atrophy.  Skin: no lesions noted on exposed skin Neuro: Alert and oriented x4. CN grossly intact Psych: Normal mood and normal affect   Assessment & Plan:   Hypothyroidism Pt has hypothyroidism and appear to be non-adherent to her regimen of levothyroxine . Will check TSH but will not adjust her dose until she is regularly taking it. Discussed that she needs to closely follow with our clinic as she has multiple chronic conditions that are uncontrolled due to her non-adherence.   Vitamin D deficiency Recheck vitamin D level with blood work.   Diabetes mellitus type 2 with complications (HCC) A1c this visit is 14.0. Pt appears to be missing doses of her insulin  and states has not taken Jardiance  as it was not covered by her insurance. Advised her to adhere to her insulin  regimen. Will make some changes to her insulin  regimen: Toujeo  42 units daily and novolog  15 units TID. Will restart Jardiance  at lower dose as pt prefers the lower  dose and start Mounjauro. Will refer her to [facility] for diabetes education as she will benefit from reinforcement. Discussed that she needs to closely follow with our clinic as she has multiple chronic conditions that are uncontrolled due to her non-adherence. Advised follow up next week.   Hyperlipidemia associated with type 2 diabetes mellitus (HCC) Pt with HLD and uncontrolled DMII. She is not on a statin but will get lipid panel and start high intensity statin for this patient.   Healthcare maintenance Microalbumin:creatinine ratio done this visit. Address more care gaps at follow up next week.    See  Encounters Tab for problem based charting.  Patient Discussed with Dr. Laurin Popp, MD Tommas Fragmin. [facility] Internal Medicine Residency, PGY-3

## 2023-08-04 ENCOUNTER — Ambulatory Visit: Payer: Self-pay | Admitting: Internal Medicine

## 2023-08-04 ENCOUNTER — Other Ambulatory Visit: Payer: Self-pay

## 2023-08-04 DIAGNOSIS — E1169 Type 2 diabetes mellitus with other specified complication: Secondary | ICD-10-CM | POA: Insufficient documentation

## 2023-08-04 LAB — BMP8+ANION GAP
Anion Gap: 17 mmol/L (ref 10.0–18.0)
BUN/Creatinine Ratio: 16 (ref 12–28)
BUN: 13 mg/dL (ref 8–27)
CO2: 21 mmol/L (ref 20–29)
Calcium: 9.2 mg/dL (ref 8.7–10.3)
Chloride: 98 mmol/L (ref 96–106)
Creatinine, Ser: 0.81 mg/dL (ref 0.57–1.00)
Glucose: 320 mg/dL — ABNORMAL HIGH (ref 70–99)
Potassium: 4.8 mmol/L (ref 3.5–5.2)
Sodium: 136 mmol/L (ref 134–144)
eGFR: 81 mL/min/{1.73_m2} (ref >59)

## 2023-08-04 LAB — MICROALBUMIN / CREATININE URINE RATIO
Creatinine, Urine: 93.9 mg/dL
Microalb/Creat Ratio: 14 mg/g{creat} (ref 0–29)
Microalbumin, Urine: 12.7 ug/mL

## 2023-08-04 LAB — LIPID PANEL
Chol/HDL Ratio: 4.2 ratio (ref 0.0–4.4)
Cholesterol, Total: 182 mg/dL (ref 100–199)
HDL: 43 mg/dL (ref >39)
LDL Chol Calc (NIH): 119 mg/dL — ABNORMAL HIGH (ref 0–99)
Triglycerides: 110 mg/dL (ref 0–149)
VLDL Cholesterol Cal: 20 mg/dL (ref 5–40)

## 2023-08-04 LAB — TSH: TSH: 4.04 u[IU]/mL (ref 0.450–4.500)

## 2023-08-04 LAB — VITAMIN D 25 HYDROXY (VIT D DEFICIENCY, FRACTURES): Vit D, 25-Hydroxy: 14.7 ng/mL — ABNORMAL LOW (ref 30.0–100.0)

## 2023-08-04 LAB — MAGNESIUM: Magnesium: 2 mg/dL (ref 1.6–2.3)

## 2023-08-04 LAB — T4, FREE: Free T4: 1.51 ng/dL (ref 0.82–1.77)

## 2023-08-04 MED ORDER — VITAMIN D (ERGOCALCIFEROL) 1.25 MG (50000 UNIT) PO CAPS
50000.0000 [IU] | ORAL_CAPSULE | ORAL | 0 refills | Status: AC
  Filled 2023-08-04: qty 8, 56d supply, fill #0

## 2023-08-04 NOTE — Addendum Note (Signed)
 Addended by: Jackolyn Masker on: 08/04/2023 09:08 AM   Modules accepted: Orders

## 2023-08-04 NOTE — Assessment & Plan Note (Addendum)
 Pt with HLD and uncontrolled DMII. She is not on a statin but will get lipid panel and start high intensity statin for this patient.

## 2023-08-04 NOTE — Assessment & Plan Note (Signed)
 Recheck vitamin D level with blood work.

## 2023-08-04 NOTE — Assessment & Plan Note (Signed)
 Pt has hypothyroidism and appear to be non-adherent to her regimen of levothyroxine . Will check TSH but will not adjust her dose until she is regularly taking it. Discussed that she needs to closely follow with our clinic as she has multiple chronic conditions that are uncontrolled due to her non-adherence.

## 2023-08-04 NOTE — Assessment & Plan Note (Addendum)
 A1c this visit is 14.0. Pt appears to be missing doses of her insulin  and states has not taken Jardiance  as it was not covered by her insurance. Advised her to adhere to her insulin  regimen. Will make some changes to her insulin  regimen: Toujeo  42 units daily and novolog  15 units TID. Will restart Jardiance  at lower dose as pt prefers the lower dose and start Mounjauro. Will refer her to [facility] for diabetes education as she will benefit from reinforcement. Discussed that she needs to closely follow with our clinic as she has multiple chronic conditions that are uncontrolled due to her non-adherence. Advised follow up next week.

## 2023-08-04 NOTE — Assessment & Plan Note (Signed)
 Microalbumin:creatinine ratio done this visit. Address more care gaps at follow up next week.

## 2023-08-05 ENCOUNTER — Other Ambulatory Visit (HOSPITAL_COMMUNITY): Payer: Self-pay

## 2023-08-08 NOTE — Progress Notes (Signed)
 Internal Medicine Clinic Attending  Case discussed with the resident at the time of the visit.  We reviewed the resident's history and exam and pertinent patient test results.  I agree with the assessment, diagnosis, and plan of care documented in the resident's note.

## 2023-08-15 ENCOUNTER — Other Ambulatory Visit: Payer: Self-pay

## 2023-08-31 ENCOUNTER — Other Ambulatory Visit: Payer: Self-pay

## 2023-08-31 ENCOUNTER — Other Ambulatory Visit: Payer: Self-pay | Admitting: Student

## 2023-08-31 DIAGNOSIS — F418 Other specified anxiety disorders: Secondary | ICD-10-CM

## 2023-09-01 ENCOUNTER — Other Ambulatory Visit: Payer: Self-pay

## 2023-09-01 MED ORDER — BUSPIRONE HCL 5 MG PO TABS
5.0000 mg | ORAL_TABLET | Freq: Three times a day (TID) | ORAL | 2 refills | Status: AC
  Filled 2023-09-01 – 2023-11-16 (×2): qty 90, 30d supply, fill #0

## 2023-09-05 ENCOUNTER — Other Ambulatory Visit: Payer: Self-pay | Admitting: Internal Medicine

## 2023-09-05 ENCOUNTER — Other Ambulatory Visit: Payer: Self-pay

## 2023-09-05 DIAGNOSIS — E118 Type 2 diabetes mellitus with unspecified complications: Secondary | ICD-10-CM

## 2023-09-05 MED ORDER — MOUNJARO 2.5 MG/0.5ML ~~LOC~~ SOAJ
2.5000 mg | SUBCUTANEOUS | 0 refills | Status: DC
  Filled 2023-09-05 – 2023-09-12 (×3): qty 2, 28d supply, fill #0

## 2023-09-06 ENCOUNTER — Other Ambulatory Visit: Payer: Self-pay

## 2023-09-07 ENCOUNTER — Other Ambulatory Visit: Payer: Self-pay

## 2023-09-08 ENCOUNTER — Other Ambulatory Visit: Payer: Self-pay | Admitting: Student

## 2023-09-08 ENCOUNTER — Other Ambulatory Visit: Payer: Self-pay

## 2023-09-08 DIAGNOSIS — E118 Type 2 diabetes mellitus with unspecified complications: Secondary | ICD-10-CM

## 2023-09-12 ENCOUNTER — Other Ambulatory Visit: Payer: Self-pay

## 2023-09-12 ENCOUNTER — Other Ambulatory Visit (HOSPITAL_COMMUNITY): Payer: Self-pay

## 2023-09-13 ENCOUNTER — Other Ambulatory Visit: Payer: Self-pay

## 2023-09-14 ENCOUNTER — Other Ambulatory Visit: Payer: Self-pay

## 2023-09-14 ENCOUNTER — Telehealth: Payer: Self-pay | Admitting: *Deleted

## 2023-09-14 NOTE — Telephone Encounter (Unsigned)
 Copied from CRM [id number]. Topic: Clinical - Prescription Issue >> Sep 14, 2023 11:47 AM Kathy Ramirez ORN wrote: Reason for CRM: Patient states she has been on the phone with her insurance company because there is an issue with getting her Mounjaro  filled. She stated that they explained the issue to her. She was told that she needs to go up to the next dosage, 5.0 mg as long as her body can tolerate it each month. Insurance will not cover the medication if she's staying on 2.5 mg. Patient states her dose was due on Monday, 09/12/23 and she's just now finding out why she couldn't get it filled. Prescription needs to be sent to [facility] on [address]. Patient is also requesting for the nurse to give her a call. CB #: [id number].

## 2023-09-15 ENCOUNTER — Other Ambulatory Visit: Payer: Self-pay

## 2023-09-15 ENCOUNTER — Other Ambulatory Visit: Payer: Self-pay | Admitting: Student

## 2023-09-15 ENCOUNTER — Telehealth: Payer: Self-pay

## 2023-09-15 DIAGNOSIS — E118 Type 2 diabetes mellitus with unspecified complications: Secondary | ICD-10-CM

## 2023-09-15 MED ORDER — TIRZEPATIDE 5 MG/0.5ML ~~LOC~~ SOAJ
5.0000 mg | SUBCUTANEOUS | 2 refills | Status: DC
  Filled 2023-09-15 – 2023-10-28 (×8): qty 2, 28d supply, fill #0
  Filled 2023-11-24: qty 2, 28d supply, fill #1
  Filled 2023-12-26: qty 2, 28d supply, fill #2

## 2023-09-15 MED ORDER — TIRZEPATIDE-WEIGHT MANAGEMENT 5 MG/0.5ML ~~LOC~~ SOAJ
5.0000 mg | SUBCUTANEOUS | 2 refills | Status: DC
  Filled 2023-09-15: qty 2, 28d supply, fill #0

## 2023-09-15 NOTE — Telephone Encounter (Signed)
 Prior Authorization for patient (Zepbound  5MG /0.5ML pen-injectors) came through on cover my meds was submitted with last office notes and labs awaiting approval or denial.  KEY: [id number]

## 2023-09-15 NOTE — Telephone Encounter (Signed)
 Kathy Ramirez (Kathy Ramirez) Rx #: [id number] Zepbound  5MG /0.5ML pen-injectors Form [employer] [place] [employer] Electronic Request Form Created Sent to [employer] Response Submit Clinical Questions Determination Unfavorable Message from [employer]. This health benefit plan does not cover the following services, supplies, drugs or charges: Any treatment or regimen, medical or surgical, for the purpose of reducing or controlling the weight of the member, or for the treatment of obesity, except for surgical treatment of morbid obesity, or as specifically covered by this health benefit plan

## 2023-09-16 ENCOUNTER — Other Ambulatory Visit: Payer: Self-pay

## 2023-09-20 ENCOUNTER — Other Ambulatory Visit: Payer: Self-pay

## 2023-09-20 NOTE — Telephone Encounter (Signed)
 Patient is calling back to check on the status of her medication. Explained to patient that her insurance denied the Prior Authorization. Called [employer], spoke with Jada. Explained to patient that her PCP is working on finding something that her insurance will approve. Patient would like a call back to discuss once something is approved for her.  [phone]

## 2023-09-21 ENCOUNTER — Other Ambulatory Visit: Payer: Self-pay

## 2023-09-23 ENCOUNTER — Other Ambulatory Visit: Payer: Self-pay

## 2023-09-29 ENCOUNTER — Other Ambulatory Visit: Payer: Self-pay

## 2023-10-07 ENCOUNTER — Telehealth: Payer: Self-pay | Admitting: *Deleted

## 2023-10-07 ENCOUNTER — Other Ambulatory Visit (HOSPITAL_COMMUNITY): Payer: Self-pay

## 2023-10-07 ENCOUNTER — Other Ambulatory Visit: Payer: Self-pay

## 2023-10-07 NOTE — Telephone Encounter (Signed)
 CRM # [id number] Owner: None Status: Resolved Open  Priority: Routine Created on: 10/07/2023 10:54 AM By: Vannie Odor   Primary Information  Source  Kathy Ramirez, Kathy Ramirez (Patient)   Subject  Kathy Ramirez, Kathy Ramirez (Patient)   Topic  Clinical - Prescription Issue    Communication  Reason for CRM: patient had been on mounjaro  2.5 only and patient's insurance deny paying for ,patient's insurance stated  as long as patient move up on dosages there would not be a problem approving, patient was suppose move up to the 5.0 and that was deny    Patient been out of the medication for 2 months    Please contact patient to let know what's going    Patient call back number [phone]

## 2023-10-07 NOTE — Telephone Encounter (Signed)
 Pt stated her insurance denied Zepbound . Kathy Ramirez stated Mounjaro  was approved. Zepbound  is on the current med list.      Note Kathy Ramirez (Key: [id number]) Rx #: [id number] Mounjaro  2.5MG /0.5ML auto-injectors Form [employer] [place] [employer] Electronic Request Form Created Sent to Plan Plan Response Submit Clinical Questions Determination Favorable Message from Plan Approved. Please note: The allowed amount is 2 mL (4 pens) per 180 days. This medication is to be increased in strength every 30 days, as tolerated by the member, to a maintenance dose of up to 15mg /0.14mL (4 pens per 28 days). The program limit of 4 pens per 180 days of the 2.5mg /0.46mL strength allows for this dose increase.. Authorization Expiration Date: August 02, 2024.

## 2023-10-10 ENCOUNTER — Encounter: Admitting: Dietician

## 2023-10-12 ENCOUNTER — Other Ambulatory Visit: Payer: Self-pay

## 2023-10-13 ENCOUNTER — Other Ambulatory Visit: Payer: Self-pay

## 2023-10-28 ENCOUNTER — Other Ambulatory Visit: Payer: Self-pay

## 2023-10-30 ENCOUNTER — Other Ambulatory Visit: Payer: Self-pay

## 2023-10-30 MED FILL — Duloxetine HCl Enteric Coated Pellets Cap 60 MG (Base Eq): ORAL | 90 days supply | Qty: 90 | Fill #1 | Status: AC

## 2023-11-02 ENCOUNTER — Other Ambulatory Visit (HOSPITAL_COMMUNITY): Payer: Self-pay

## 2023-11-16 ENCOUNTER — Other Ambulatory Visit: Payer: Self-pay | Admitting: Student

## 2023-11-16 ENCOUNTER — Other Ambulatory Visit: Payer: Self-pay

## 2023-11-16 ENCOUNTER — Other Ambulatory Visit (HOSPITAL_COMMUNITY): Payer: Self-pay

## 2023-11-16 MED ORDER — GABAPENTIN 300 MG PO CAPS
ORAL_CAPSULE | ORAL | 2 refills | Status: DC
  Filled 2023-11-16: qty 120, 30d supply, fill #0
  Filled 2023-12-26: qty 120, 30d supply, fill #1
  Filled 2024-01-24: qty 120, 30d supply, fill #2

## 2023-11-24 ENCOUNTER — Other Ambulatory Visit (HOSPITAL_COMMUNITY): Payer: Self-pay

## 2023-11-24 ENCOUNTER — Other Ambulatory Visit: Payer: Self-pay | Admitting: Internal Medicine

## 2023-11-24 ENCOUNTER — Other Ambulatory Visit: Payer: Self-pay

## 2023-11-24 DIAGNOSIS — E118 Type 2 diabetes mellitus with unspecified complications: Secondary | ICD-10-CM

## 2023-11-24 MED FILL — Empagliflozin Tab 10 MG: ORAL | 30 days supply | Qty: 30 | Fill #0 | Status: AC

## 2023-11-29 ENCOUNTER — Other Ambulatory Visit (HOSPITAL_BASED_OUTPATIENT_CLINIC_OR_DEPARTMENT_OTHER): Payer: Self-pay

## 2023-11-30 ENCOUNTER — Other Ambulatory Visit (HOSPITAL_COMMUNITY): Payer: Self-pay

## 2023-11-30 ENCOUNTER — Other Ambulatory Visit: Payer: Self-pay | Admitting: Internal Medicine

## 2023-11-30 DIAGNOSIS — E118 Type 2 diabetes mellitus with unspecified complications: Secondary | ICD-10-CM

## 2023-11-30 MED ORDER — EMPAGLIFLOZIN 10 MG PO TABS
10.0000 mg | ORAL_TABLET | Freq: Every day | ORAL | 11 refills | Status: AC
  Filled 2023-11-30 – 2023-12-26 (×2): qty 30, 30d supply, fill #0
  Filled 2024-01-24: qty 30, 30d supply, fill #1
  Filled 2024-02-24: qty 30, 30d supply, fill #2
  Filled 2024-03-27: qty 30, 30d supply, fill #3

## 2023-12-01 NOTE — Telephone Encounter (Signed)
 I called the patient to schedule a appointment. I was unable to reach the patient, I lvm for her to give us  a call back.

## 2023-12-01 NOTE — Telephone Encounter (Signed)
 I called and spoke to the patients spouse he stated the patient was asleep, he will have the patient give us  a call back to schedule a appointment.

## 2023-12-26 ENCOUNTER — Other Ambulatory Visit: Payer: Self-pay

## 2023-12-26 ENCOUNTER — Other Ambulatory Visit (HOSPITAL_COMMUNITY): Payer: Self-pay

## 2024-01-24 ENCOUNTER — Other Ambulatory Visit: Payer: Self-pay | Admitting: Internal Medicine

## 2024-01-24 ENCOUNTER — Other Ambulatory Visit: Payer: Self-pay

## 2024-01-24 ENCOUNTER — Other Ambulatory Visit: Payer: Self-pay | Admitting: Student

## 2024-01-24 DIAGNOSIS — E118 Type 2 diabetes mellitus with unspecified complications: Secondary | ICD-10-CM

## 2024-01-24 DIAGNOSIS — F418 Other specified anxiety disorders: Secondary | ICD-10-CM

## 2024-01-30 ENCOUNTER — Other Ambulatory Visit (HOSPITAL_COMMUNITY): Payer: Self-pay

## 2024-02-01 ENCOUNTER — Other Ambulatory Visit (HOSPITAL_COMMUNITY): Payer: Self-pay

## 2024-02-01 MED FILL — Buspirone HCl Tab 7.5 MG: ORAL | 30 days supply | Qty: 60 | Fill #0 | Status: AC

## 2024-02-02 ENCOUNTER — Other Ambulatory Visit: Payer: Self-pay | Admitting: Internal Medicine

## 2024-02-02 ENCOUNTER — Other Ambulatory Visit (HOSPITAL_COMMUNITY): Payer: Self-pay

## 2024-02-02 DIAGNOSIS — E113313 Type 2 diabetes mellitus with moderate nonproliferative diabetic retinopathy with macular edema, bilateral: Secondary | ICD-10-CM

## 2024-02-03 NOTE — Telephone Encounter (Signed)
 Patient is calling to check status of refill for tirzepatide  (MOUNJARO ) 5 MG/0.5ML Pen. She stated that when she requested the refill from the pharmacy she also requested an increase of the dosage to 7.5 MG.   [facility] [address], [place] [address] Phone: [phone]  Fax: [phone]

## 2024-02-06 ENCOUNTER — Other Ambulatory Visit (HOSPITAL_COMMUNITY): Payer: Self-pay

## 2024-02-06 ENCOUNTER — Other Ambulatory Visit: Payer: Self-pay | Admitting: Internal Medicine

## 2024-02-06 DIAGNOSIS — E113313 Type 2 diabetes mellitus with moderate nonproliferative diabetic retinopathy with macular edema, bilateral: Secondary | ICD-10-CM

## 2024-02-07 ENCOUNTER — Other Ambulatory Visit (HOSPITAL_COMMUNITY): Payer: Self-pay

## 2024-02-07 MED ORDER — MOUNJARO 7.5 MG/0.5ML ~~LOC~~ SOAJ
7.5000 mg | SUBCUTANEOUS | 2 refills | Status: DC
  Filled 2024-02-07 – 2024-02-17 (×2): qty 2, 28d supply, fill #0
  Filled 2024-03-15 – 2024-03-22 (×2): qty 2, 28d supply, fill #1

## 2024-02-08 ENCOUNTER — Other Ambulatory Visit: Payer: Self-pay

## 2024-02-08 ENCOUNTER — Other Ambulatory Visit (HOSPITAL_COMMUNITY): Payer: Self-pay

## 2024-02-08 ENCOUNTER — Ambulatory Visit: Payer: Self-pay

## 2024-02-08 VITALS — BP 99/64 | HR 69 | Temp 97.7°F | Ht 66.0 in | Wt 283.0 lb

## 2024-02-08 DIAGNOSIS — E113313 Type 2 diabetes mellitus with moderate nonproliferative diabetic retinopathy with macular edema, bilateral: Secondary | ICD-10-CM

## 2024-02-08 DIAGNOSIS — R42 Dizziness and giddiness: Secondary | ICD-10-CM

## 2024-02-08 DIAGNOSIS — B353 Tinea pedis: Secondary | ICD-10-CM | POA: Diagnosis not present

## 2024-02-08 DIAGNOSIS — E559 Vitamin D deficiency, unspecified: Secondary | ICD-10-CM

## 2024-02-08 DIAGNOSIS — E785 Hyperlipidemia, unspecified: Secondary | ICD-10-CM | POA: Diagnosis not present

## 2024-02-08 DIAGNOSIS — F411 Generalized anxiety disorder: Secondary | ICD-10-CM

## 2024-02-08 DIAGNOSIS — T7840XS Allergy, unspecified, sequela: Secondary | ICD-10-CM

## 2024-02-08 LAB — POCT GLYCOSYLATED HEMOGLOBIN (HGB A1C): HbA1c, POC (controlled diabetic range): 8 % — AB (ref 0.0–7.0)

## 2024-02-08 LAB — GLUCOSE, CAPILLARY: Glucose-Capillary: 226 mg/dL — ABNORMAL HIGH (ref 70–99)

## 2024-02-08 NOTE — Progress Notes (Signed)
 CC: Follow-up  HPI:  Kathy Ramirez is a 63 y.o. female living with a history stated below and presents today for follow-up. Please see problem based assessment and plan for additional details.   Past Medical History:  Diagnosis Date   Depression    Diabetes mellitus with complication (HCC)    Diabetic retinopathy (HCC)    Thyroid  disease     Current Outpatient Medications on File Prior to Visit  Medication Sig Dispense Refill   busPIRone  (BUSPAR ) 7.5 MG tablet Take 1 tablet (7.5 mg total) by mouth 2 (two) times daily. 60 tablet 2   DULoxetine  (CYMBALTA ) 60 MG capsule Take 1 capsule (60 mg total) by mouth daily. 90 capsule 3   empagliflozin  (JARDIANCE ) 10 MG TABS tablet Take 1 tablet (10 mg total) by mouth daily before breakfast. 30 tablet 11   gabapentin  (NEURONTIN ) 300 MG capsule Take 1 capsule (300 mg total) by mouth 2 (two) times daily AND 2 capsules (600 mg total) at bedtime. 120 capsule 2   Insulin  Pen Needle (PEN NEEDLES) 32G X 4 MM MISC Use with novolog  3 times daily and 1 time daily with trojeo 100 each 2   ipratropium (ATROVENT ) 0.06 % nasal spray Place 2 sprays into both nostrils 4 (four) times daily. 15 mL 0   levothyroxine  (SYNTHROID ) 100 MCG tablet Take 1 tablet (100 mcg total) by mouth daily. 30 tablet 11   NOVOLOG  FLEXPEN 100 UNIT/ML FlexPen Inject 15 Units into the skin 3 (three) times daily with meals. 33 mL 3   rosuvastatin  (CRESTOR ) 20 MG tablet Take 1 tablet (20 mg total) by mouth daily. 30 tablet 11   tirzepatide  (MOUNJARO ) 7.5 MG/0.5ML Pen Inject 7.5 mg into the skin once a week. 2 mL 2   TOUJEO  SOLOSTAR 300 UNIT/ML Solostar Pen Inject 42 Units into the skin daily. 9 mL 0   No current facility-administered medications on file prior to visit.    Family History  Problem Relation Age of Onset   Diabetes Mother    Alzheimer's disease Mother    Lung cancer Mother    Liver cancer Son     Social History   Socioeconomic History   Marital status: Married     Spouse name: Not on file   Number of children: Not on file   Years of education: Not on file   Highest education level: Not on file  Occupational History   Not on file  Tobacco Use   Smoking status: Former    Current packs/day: 0.00    Average packs/day: 1 pack/day for 30.0 years (30.0 ttl pk-yrs)    Types: Cigarettes    Start date: 38    Quit date: 2012    Years since quitting: 13.9   Smokeless tobacco: Never   Tobacco comments:    quit 2012  Vaping Use   Vaping status: Never Used  Substance and Sexual Activity   Alcohol use: Yes    Comment: Drinks a couple ounces of rum most days of the week   Drug use: Never   Sexual activity: Not on file  Other Topics Concern   Not on file  Social History Narrative   Not on file   Social Drivers of Health   Tobacco Use: Medium Risk (02/08/2024)   Patient History    Smoking Tobacco Use: Former    Smokeless Tobacco Use: Never    Passive Exposure: Not on Actuary Strain: Not on file  Food Insecurity: No Food  Insecurity (02/08/2024)   Epic    Worried About Programme Researcher, Broadcasting/film/video in the Last Year: Never true    Ran Out of Food in the Last Year: Never true  Transportation Needs: No Transportation Needs (02/08/2024)   Epic    Lack of Transportation (Medical): No    Lack of Transportation (Non-Medical): No  Physical Activity: Not on file  Stress: Not on file  Social Connections: Not on file  Intimate Partner Violence: Not on file  Depression (EYV7-0): High Risk (02/08/2024)   Depression (PHQ2-9)    PHQ-2 Score: 13  Alcohol Screen: Not on file  Housing: Not on file  Utilities: At Risk (02/08/2024)   Epic    Threatened with loss of utilities: Yes  Health Literacy: Not on file    Review of Systems: ROS  Per HPI, assessment, plan Vitals:   02/08/24 1318 02/08/24 1330  BP: (!) 126/46 99/64  Pulse: 72 69  Temp: 97.7 F (36.5 C)   TempSrc: Oral   SpO2: 97%   Weight: 283 lb (128.4 kg)   Height: 5' 6  (1.676 m)     Physical Exam: Physical Exam HENT:     Head: Normocephalic.  Cardiovascular:     Rate and Rhythm: Normal rate and regular rhythm.     Comments: Difficult to appreciate RP BL Pulmonary:     Effort: Pulmonary effort is normal.     Breath sounds: Normal breath sounds.  Skin:    Comments: Excessive dryness along with mild scaly skin noted on bilateral feet.  Thickened toenails noted in several toes BL.  Neurological:     Mental Status: She is alert.      Assessment & Plan:     Patient seen with Dr. 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  A1c was 14.0 previously, and has improved significantly to 8 today.  Patient reports she has been taking her Toujeo  42 units daily.  Is required to take NovoLog  15 units 3 times daily with meals, however, reports that she often does not take meals 3 times a day so only takes NovoLog  when she eats.  Has felt much better ever since starting Jardiance  10 mg, however, mentions that she will not be having insurance starting January up until March and will not be able to afford Jardiance  during that period.  Reports her Mounjaro  was increased from 5 to 7.5 mg recently, however,  Mounjaro  is not being covered by her insurance at present and requires over thousands of dollars out-of-pocket, which she cannot afford.  Informed patient to continue taking her 5 mg Mounjaro  as she still has a pen left.  Gave her 21-day supply of Jardiance  through samples.  Will reach out to [facility] to help with her medication management up until patient's Medicare starts in April.  Patient reports she does not check her sugars often, however, does use her husband's strip sometimes to check them.  When she does check she has noticed her sugars being in mid 100s to over 200s.  She does  report trying Ozempic  in the past and that caused GI upset.  Counseled patient on trying to stick to either 3 meals if she takes short acting insulin  3 times daily and asked her to avoid short acting insulin  without having a meal in the future.  Patient stated understanding. -  21-day supply of sample Jardiance  10 mg daily given to patient -Toujeo  42 units daily -NovoLog  15 units 3 times daily with meals -Referral to Dr. Brinda for medication management Dizziness Patient reports she had 1 episode of feeling dizzy with a fall during which she hit her head in the past 6 months.  She has been experiencing dizziness couple times a week and describes it as a feeling of being lightheaded on and off.  She reports no LOC.  Does experience dizziness sometimes due to positional changes and sometimes while she is even walking.  Denies room spinning or ringing in the ears.  Does report dizziness has not been happening a lot this past month.  Her BP readings from today were as follows: 126/46 with a repeat of 99/64.  Symptoms could be due to orthostatic hypotension, BPPV, and adequate hydration. Orthostatics checked today were negative.  Discussed with patient that she needs to increase her hydration intake because she is also on Jardiance .  Hypoglycemia lower on the differential as dizziness is not sustained.  Will monitor in the future for worsening of symptoms. -Manage with adequate fluid intake Tinea pedis of both feet Patient's feet were very dry on PE.  Also noticed thickening of toenails bilaterally.  Do suspect fungal infection.  Referral to podiatry can be considered in the future once patient has Medicare insurance coverage and April 2026.  Gave patient's option affordable OTC antifungal creams to be used in the meantime. -Clotrimazole 1% cream or terbinafine 1% cream on feet twice daily as needed for 2 weeks - Podiatry referral in the future once Medicare has been established Hyperlipidemia, unspecified  hyperlipidemia type Patient continues to take her rosuvastatin  20 mg.  Her LDL levels were elevated on the last lipid panel.  Will get lipid panel in the future once her Medicare coverage begins. - Continue rosuvastatin  20 mg daily - Lipid panel in the future Vitamin D  deficiency Patient's vitamin D  levels in 6/25 were low at 14.7.  Will recheck levels once insurance coverage with Medicare is established.  In the meantime, instructed patient to start using vitamin D  2000 IU every day or 5000 IU every day as those were the 2 affordable options at [facility].  Patient stated understanding. -Vitamin D  supplementation: 2 tablets of 1000 IU daily or 1 tablet of 5000 IU daily -Recheck vitamin D  levels after Medicare coverage is established in the future Allergy, sequela Patient has been dealing with allergies since childhood.  She is currently managing them with Benadryl 1-2 times a week or Afrin 1-2 times a week, although states Benadryl works better.  She experiences constant sneezing.  Reports she is allergic to a lot of food substances.  Reports that her allergies are not bad to the point that she needs to use epinephrine.  Discussed with patient that excessive Benadryl use can cause drowsiness, dizziness etc. counseled her on using over-the-counter antihistamines like Allegra.  Patient stated understanding. -Start OTC Allegra as needed for allergies -Reduce the use of Benadryl GAD (generalized anxiety disorder) Patient reports that her BuSpar  was changed from 5 mg to 7.5 mg.  This increased dosing caused shaking in her left hand and she would like to go back to 5 mg.  Changed patient's BuSpar  from 7.5 mg twice daily to 5 mg 3 times daily. -Stop BuSpar  7.5 mg twice daily - restart BuSpar  5 mg 3 times daily  Orders Placed This Encounter  Procedures   Glucose, capillary   POC Hbg  A1C     Rebecka Pion, D.O. [facility] Internal Medicine, PGY-1 Date 02/09/2024 Time 9:55 AM

## 2024-02-08 NOTE — Patient Instructions (Addendum)
 Please follow instructions as discussed in today's plan: - Please continue taking your Toujeo  42 units daily. - You can continue using your NovoLog  15 units 3 times with meals.  However, if you are only eating 2 times a day we recommend you take NovoLog  2 times for that day with meals. - You can try clotrimazole 1% cream or terbinafine 1% cream on your feet 1-2 times per day for 2 weeks -Please take 2 tablets of 1000 IU vitamin D  every day or 1 tablet of vitamin D  5000 IU every day. -Our pharmacist Dr. Brinda will be reaching out to you to help your medication management with Mounjaro . -You can continue the free sample supply of Jardiance  in the meantime as well -Continue taking your other medications -We have switched your BuSpar  from 7.5 mg to 5 mg that needs to be taken 3 times daily  Thank you  Rebecka Pion, DO

## 2024-02-09 NOTE — Assessment & Plan Note (Signed)
 Patient's vitamin D  levels in 6/25 were low at 14.7.  Will recheck levels once insurance coverage with Medicare is established.  In the meantime, instructed patient to start using vitamin D  2000 IU every day or 5000 IU every day as those were the 2 affordable options at [facility] pharmacy.  Patient stated understanding. -Vitamin D  supplementation: 2 tablets of 1000 IU daily or 1 tablet of 5000 IU daily -Recheck vitamin D  levels after Medicare coverage is established in the future

## 2024-02-10 NOTE — Addendum Note (Signed)
 Addended by: Dagen Beevers on: 02/10/2024 11:39 AM   Modules accepted: Orders

## 2024-02-14 ENCOUNTER — Ambulatory Visit: Payer: Self-pay

## 2024-02-16 NOTE — Progress Notes (Signed)
 Internal Medicine Clinic Attending  I was physically present during the key portions of the resident provided service and participated in the medical decision making of patient's management care. I reviewed pertinent patient test results.  The assessment, diagnosis, and plan were formulated together and I agree with the documentation in the resident's note.  Rosan Dayton BROCKS, DO

## 2024-02-17 MED FILL — Duloxetine HCl Enteric Coated Pellets Cap 60 MG (Base Eq): ORAL | 90 days supply | Qty: 90 | Fill #2 | Status: AC

## 2024-02-24 ENCOUNTER — Other Ambulatory Visit: Payer: Self-pay | Admitting: Student

## 2024-02-27 ENCOUNTER — Other Ambulatory Visit: Payer: Self-pay

## 2024-02-27 ENCOUNTER — Other Ambulatory Visit (HOSPITAL_COMMUNITY): Payer: Self-pay

## 2024-02-28 ENCOUNTER — Telehealth: Payer: Self-pay

## 2024-02-28 NOTE — Progress Notes (Unsigned)
 Care Guide Pharmacy Note  02/28/2024 Name: RAQUELLE PIETRO MRN: [id number] DOB: 02/24/1961  Referred By: Elnora Ip, MD Reason for referral: Complex Care Management and Call Attempt #1 (Unsuccessful initial outreach to schedule with PHARM D- [place])   RAELYNNE LUDWICK is a 63 y.o. year old female who is a primary care patient of Elnora Ip, MD.  Diego ONEIDA Orrego was referred to the pharmacist for assistance related to: DMII  An unsuccessful telephone outreach was attempted today to contact the patient who was referred to the pharmacy team for assistance with medication assistance. Additional attempts will be made to contact the patient.  Leotis Rase [facility], [facility] Guide  Direct Dial : [phone]  Fax [phone]

## 2024-02-29 ENCOUNTER — Other Ambulatory Visit (HOSPITAL_COMMUNITY): Payer: Self-pay

## 2024-02-29 ENCOUNTER — Telehealth: Payer: Self-pay | Admitting: *Deleted

## 2024-02-29 NOTE — Telephone Encounter (Unsigned)
 Copied from CRM #[id number]. Topic: Clinical - Prescription Issue >> Feb 29, 2024  9:58 AM Chiquita SQUIBB wrote: Reason for CRM: Patients pharmacy is calling in stating the manufacturer for levothyroxine  (SYNTHROID ) 100 MCG tablet has changed, and the pharmacy just needs an OK to change this for the patient. Verbal okay or fax is fine. Please advise [facility].

## 2024-02-29 NOTE — Telephone Encounter (Signed)
 Patient is returning call, transferred to [facility].

## 2024-02-29 NOTE — Progress Notes (Signed)
 Complex Care Management Note Care Guide Note  02/29/2024 Name: Kathy Ramirez MRN: [id number] DOB: [birth year]   Complex Care Management Outreach Attempts: A second unsuccessful outreach was attempted today to offer the patient with information about available complex care management services.  Follow Up Plan:  Additional outreach attempts will be made to offer the patient complex care management information and services.   Encounter Outcome:  Patient Request to Call Back  Leotis Rase [facility], [facility] Guide  Direct Dial : [phone]  Fax [phone]

## 2024-03-02 ENCOUNTER — Other Ambulatory Visit (HOSPITAL_COMMUNITY): Payer: Self-pay

## 2024-03-04 MED ORDER — GABAPENTIN 300 MG PO CAPS
ORAL_CAPSULE | ORAL | 2 refills | Status: AC
  Filled 2024-03-04: qty 120, 30d supply, fill #0
  Filled 2024-04-02: qty 120, 30d supply, fill #1

## 2024-03-05 ENCOUNTER — Other Ambulatory Visit (HOSPITAL_COMMUNITY): Payer: Self-pay

## 2024-03-13 ENCOUNTER — Other Ambulatory Visit (HOSPITAL_COMMUNITY): Payer: Self-pay

## 2024-03-16 ENCOUNTER — Telehealth: Payer: Self-pay

## 2024-03-16 ENCOUNTER — Other Ambulatory Visit: Payer: Self-pay

## 2024-03-16 NOTE — Telephone Encounter (Signed)
 Prior Authorization for patient (Mounjaro  7.5MG /0.5ML auto-injectors) came through on cover my meds was submitted with last office notes and labs awaiting approval or denial.  XZB:[id number]

## 2024-03-19 ENCOUNTER — Other Ambulatory Visit: Payer: Self-pay

## 2024-03-19 NOTE — Telephone Encounter (Signed)
 YOU ASKED FOR: Service Description Code 1 Code 2 Plan Requested  Dates Requested  Amount MOUNJARO  7.5MG /0.5ML Soln  Auto-inj [id number] Medicaid 03/16/2024 to  03/16/2025 24 WE DENIED: Service Description Code 1 Code 2 Plan Denied Dates Denied  Amount MOUNJARO  7.5MG /0.5ML Soln  Auto-inj [id number] Medicaid 03/16/2024 to  03/16/2025 24 We denied your request for:   [id number] MOUNJARO  7.5MG /0.5ML Soln Auto-inj Policy rules found at [facility] Division of Health Benefits Outpatient Pharmacy Prior Approval Criteria for GLP-1  Receptor Agonists and Combinations guided our decision. Here are the policy requirements your request did not meet:   Our records show you have not tried and failed at least one of these preferred drugs:  Trulicity  Pen or Victoza Pen  o If you cannot use these drugs, your doctor must send us  chart notes that tell us  why  o If you have already tried these drugs, your doctor must send us  chart notes showing this We cannot approve the request because your doctor did not send this information

## 2024-03-20 ENCOUNTER — Other Ambulatory Visit: Payer: Self-pay

## 2024-03-22 ENCOUNTER — Other Ambulatory Visit (HOSPITAL_COMMUNITY): Payer: Self-pay

## 2024-03-22 ENCOUNTER — Other Ambulatory Visit: Payer: Self-pay

## 2024-03-22 ENCOUNTER — Telehealth (HOSPITAL_COMMUNITY): Payer: Self-pay

## 2024-03-22 MED ORDER — TRULICITY 1.5 MG/0.5ML ~~LOC~~ SOAJ
1.5000 mg | SUBCUTANEOUS | 3 refills | Status: AC
  Filled 2024-03-22 – 2024-03-23 (×2): qty 2, 28d supply, fill #0

## 2024-03-23 ENCOUNTER — Telehealth (HOSPITAL_COMMUNITY): Payer: Self-pay

## 2024-03-23 ENCOUNTER — Other Ambulatory Visit (HOSPITAL_COMMUNITY): Payer: Self-pay

## 2024-03-23 NOTE — Telephone Encounter (Signed)
 Pharmacy Patient Advocate Encounter   Received notification from Pt Calls Messages that prior authorization for Trulicity  1.5MG /0.5ML auto-injectors  is required/requested.   Insurance verification completed.   The patient is insured through [employer].   Per test claim: PA required; PA submitted to above mentioned insurance via Latent Key/confirmation #/EOC [id number] Status is pending

## 2024-03-23 NOTE — Telephone Encounter (Signed)
 PA request has been Received. New Encounter has been or will be created for follow up. For additional info see Pharmacy Prior Auth telephone encounter from 03/23/24.

## 2024-03-23 NOTE — Telephone Encounter (Signed)
 Pharmacy Patient Advocate Encounter  Received notification from [facility] MEDICAID that Prior Authorization for Trulicity  1.5MG /0.5ML auto-injectors  has been APPROVED from 03/23/24 to 03/23/25. Ran test claim, Copay is $4. This test claim was processed through [facility]- copay amounts may vary at other pharmacies due to pharmacy/plan contracts, or as the patient moves through the different stages of their insurance plan.   PA #/Case ID/Reference #: [id number]

## 2024-03-27 ENCOUNTER — Other Ambulatory Visit: Payer: Self-pay

## 2024-03-27 ENCOUNTER — Other Ambulatory Visit: Payer: Self-pay | Admitting: Internal Medicine

## 2024-03-27 DIAGNOSIS — E118 Type 2 diabetes mellitus with unspecified complications: Secondary | ICD-10-CM

## 2024-03-28 ENCOUNTER — Telehealth: Payer: Self-pay

## 2024-03-28 ENCOUNTER — Other Ambulatory Visit: Payer: Self-pay | Admitting: Student

## 2024-03-28 ENCOUNTER — Other Ambulatory Visit: Payer: Self-pay

## 2024-03-28 MED FILL — Insulin Glargine Soln Pen-Injector 300 Unit/ML (1 Unit Dial): SUBCUTANEOUS | 64 days supply | Qty: 9 | Fill #0 | Status: CN

## 2024-03-28 NOTE — Telephone Encounter (Signed)
 Medication sent to pharmacy

## 2024-03-28 NOTE — Telephone Encounter (Signed)
 YOU ASKED FOR: Service Description Code 1 Code 2 Plan Requested  Dates Requested  Amount TOUJEO  SOLOSTAR 300UNIT/ML  Soln Pen-inj [id number] Medicaid 03/28/2024 to  03/28/2025 108 WE DENIED: Service Description Code 1 Code 2 Plan Denied Dates Denied  Amount TOUJEO  SOLOSTAR 300UNIT/ML  Soln Pen-inj [id number] Medicaid 03/28/2024 to  03/28/2025 108 We denied your request for:  [id number] TOUJEO  SOLOSTAR 300UNIT/ML Soln Pen-inj Policy rules found on the [place]  Medicaid Preferred Drug List (PDL) guided our decision. Here are the policy requirements your request did not meet: The request does not meet the [place]  Medicaid Preferred Drug List (PDL) trial and failure criteria for  the use of a non-preferred drug. To meet the criteria for approval, you must first try one of the following  preferred drugs on the [place] Preferred Drug List (PDL) (this is a list of covered drugs):  Lantus  Pen/vial (brand), insulin  glargine vial / SoloStar (authorized biologic for Lantus )

## 2024-03-28 NOTE — Telephone Encounter (Signed)
 Prior Authorization for patient (Toujeo  SoloStar 300UNIT/ML pen-injectors) came through on cover my meds was submitted with last office notes and labs awaiting approval or denial.  XZB:[id number]

## 2024-03-29 ENCOUNTER — Other Ambulatory Visit (HOSPITAL_COMMUNITY): Payer: Self-pay

## 2024-03-29 ENCOUNTER — Other Ambulatory Visit: Payer: Self-pay

## 2024-03-30 ENCOUNTER — Other Ambulatory Visit: Payer: Self-pay

## 2024-04-03 ENCOUNTER — Other Ambulatory Visit: Payer: Self-pay

## 2024-04-05 ENCOUNTER — Ambulatory Visit: Payer: Self-pay

## 2024-04-05 ENCOUNTER — Other Ambulatory Visit: Payer: Self-pay

## 2024-04-05 ENCOUNTER — Telehealth: Payer: Self-pay | Admitting: Student

## 2024-04-05 ENCOUNTER — Other Ambulatory Visit (HOSPITAL_COMMUNITY): Payer: Self-pay

## 2024-04-05 ENCOUNTER — Ambulatory Visit

## 2024-04-05 ENCOUNTER — Telehealth: Payer: Self-pay

## 2024-04-05 ENCOUNTER — Telehealth: Payer: Self-pay | Admitting: *Deleted

## 2024-04-05 DIAGNOSIS — E118 Type 2 diabetes mellitus with unspecified complications: Secondary | ICD-10-CM

## 2024-04-05 DIAGNOSIS — F411 Generalized anxiety disorder: Secondary | ICD-10-CM

## 2024-04-05 MED ORDER — INSULIN GLARGINE 100 UNIT/ML SOLOSTAR PEN
42.0000 [IU] | PEN_INJECTOR | Freq: Every day | SUBCUTANEOUS | 3 refills | Status: AC
  Filled 2024-04-05: qty 15, 35d supply, fill #0
  Filled 2024-04-06: qty 30, 71d supply, fill #0

## 2024-04-05 MED ORDER — ACCU-CHEK FASTCLIX LANCET KIT
PACK | 3 refills | Status: AC
  Filled 2024-04-05 – 2024-04-06 (×2): qty 1, 30d supply, fill #0

## 2024-04-05 MED ORDER — TRULICITY 3 MG/0.5ML ~~LOC~~ SOAJ
3.0000 mg | SUBCUTANEOUS | 3 refills | Status: AC
  Filled 2024-04-05: qty 2, 28d supply, fill #0

## 2024-04-05 MED ORDER — BUSPIRONE HCL 5 MG PO TABS
5.0000 mg | ORAL_TABLET | Freq: Three times a day (TID) | ORAL | 3 refills | Status: AC
  Filled 2024-04-05 – 2024-04-06 (×2): qty 90, 30d supply, fill #0

## 2024-04-05 MED ORDER — ACCU-CHEK GUIDE TEST VI STRP
ORAL_STRIP | 11 refills | Status: AC
  Filled 2024-04-05: qty 100, 33d supply, fill #0
  Filled 2024-04-06: qty 100, 30d supply, fill #0

## 2024-04-05 MED ORDER — ACCU-CHEK SOFTCLIX LANCETS MISC
11 refills | Status: DC
  Filled 2024-04-05: qty 100, 33d supply, fill #0

## 2024-04-05 NOTE — Telephone Encounter (Signed)
 Copied from CRM [id number]. Topic: Clinical - Prescription Issue >> Apr 05, 2024  2:43 PM Debby BROCKS wrote: Reason for CRM: Patient had an appointment with the pharmacist today and forgot to mention that the lancets that she uses would need to be Accu check Fastclix and not the softclix version

## 2024-04-05 NOTE — Progress Notes (Signed)
 Complex Care Management Note Care Guide Note  04/05/2024 Name: Kathy Ramirez MRN: [id number] DOB: 03/10/1960   Complex Care Management Outreach Attempts: An unsuccessful telephone outreach was attempted today to offer the patient information about available complex care management services.  Follow Up Plan:  No further outreach attempts will be made at this time. We have been unable to contact the patient to offer or enroll patient in complex care management services.  Encounter Outcome:  Patient Refused-Pt will call back to reschedule at a later time.  Leotis Rase [place], [facility] Guide  Direct Dial : [phone]  Fax [phone]

## 2024-04-05 NOTE — Telephone Encounter (Signed)
 Message forwarded to pharmacist to make her aware that patient wanted to cancel her appt for today.  Copied from CRM [id number]. Topic: Appointments - Scheduling Inquiry for Clinic >> Apr 04, 2024 12:26 PM Chiquita SQUIBB wrote: Reason for CRM: Patient is calling in to cancel her Pharmacist appointment for 2/5, unable to cancel. Unable to reach CAL due to lunch time. Patient is requesting a call back to confirm that the office cancelled the appointment.

## 2024-04-05 NOTE — Progress Notes (Signed)
 "  04/05/2024 Name: Kathy Ramirez MRN: [id number] DOB: Jul 24, 1960  Chief Complaint  Patient presents with   Diabetes    Kathy Ramirez is a 64 y.o. year old female who presented for a telephone visit. Originally was scheduled as a face-to-face visit, but patient needed to transition to a telephone call. Call was slightly abbreviated as patient was worked into the afternoon schedule after she cancelled her in person appt.   They were referred to the pharmacist by their PCP for assistance in managing diabetes. 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   Subjective: Patient was last seen by PCP, Dr. Edgardo, on 02/08/24. At last visit, A1C had improved from 14% to 8.0% on Toujeo , Novolog , Jardiance , and Mounjaro . However, she was going to have a change in insurance and was concerned about continue coverage of medications. She ended up being [place] in [place]. She was transitioned from Mounjaro  7.5 mg weekly to Trulicity  1.5 mg weekly for coverage. Has also been notified that Toujeo  U300 will not be covered through Medicaid until she tries Lantus . She was given samples of Jardiance  10 mg at last OV.  Today, patient reports doing well. States she just used last dose of Mounjaro  7.5 mg. Plans to transition to Trulicity  1.5 mg next week. She says she has a good supply of Toujeo  at home. Also recently filled Novolog .    Care Team: Primary Care Provider: Elnora Ip, MD ; Next Scheduled Visit: 05/21/24 with Dr. Isobel  Medication Access/Adherence  Current Pharmacy:  [facility] [address], [address] [place] [address] Phone: [phone] Fax: [phone]   Patient reports affordability concerns with their medications: No  - Currently has Medicaid Patient reports access/transportation concerns to their pharmacy: Yes  - reports it is sometimes difficult for her to get to appointments Patient  reports adherence concerns with their medications:  No    Medicare will start in April 2026.  Diabetes:  Current medications: Trulicity  1.5 mg weekly (last dose of Mounjaro  7.5 mg weekly), Jardiance  10 mg daily (currently using up samples), Toujeo  U300 42 units daily (in evening), Novolog  15 units up to TID with  meals (paid $99 using coupon even though Medicaid was active) Medications tried in the past: Mounjaro  (insurance change), metformin (unclear why discontinued)  Current glucose readings: Does not check regularly - has Accu Chek meter. Typically buys supplies out of pocket. Would like to obtain supplies through Medicaid if able. Reports she tried CGM a few years ago. Her and her husband both tried Libre. Did not stick to her skin. Would be willing to retrial in the future.  Patient denies hypoglycemic s/sx including dizziness, shakiness, sweating.   Current meal patterns: did not discuss in depth today  Current physical activity: did not discuss in depth today  Macrovascular and Microvascular Risk Reduction:  Statin? yes (rosuvastatin  20 mg daily); ACEi/ARB? no; therapy not indicated  Last urinary albumin/creatinine ratio:  Lab Results  Component Value Date   MICRALBCREAT 14 08/03/2023   MICRALBCREAT 25 07/29/2022   MICRALBCREAT 29 09/23/2021   MICRALBCREAT 18 10/07/2020   Last eye exam:  Lab Results  Component Value Date   HMDIABEYEEXA Retinopathy (A) 03/31/2021   Last foot exam: 02/08/2024 Tobacco Use:  Tobacco Use: Medium Risk (02/08/2024)   Patient History    Smoking Tobacco Use: Former    Smokeless Tobacco Use: Never    Passive Exposure: Not on file  Objective:  BP Readings from Last 3 Encounters:  02/08/24 99/64  08/03/23 (!) 144/52  09/08/22 (!) 146/59    Lab Results  Component Value Date   HGBA1C 8.0 (A) 02/08/2024   HGBA1C 14.0 (A) 08/03/2023   HGBA1C 13.2 (A) 07/29/2022       Latest Ref Rng & Units 08/03/2023   12:03 PM 07/29/2022    10:07 AM 11/05/2021   11:11 AM  BMP  Glucose 70 - 99 mg/dL 679  735  841   BUN 8 - 27 mg/dL 13  12  12    Creatinine 0.57 - 1.00 mg/dL 9.18  9.15  9.10   BUN/Creat Ratio 12 - 28 16  14  13    Sodium 134 - 144 mmol/L 136  140  142   Potassium 3.5 - 5.2 mmol/L 4.8  4.4  4.6   Chloride 96 - 106 mmol/L 98  101  101   CO2 20 - 29 mmol/L 21  23  22    Calcium  8.7 - 10.3 mg/dL 9.2  9.1  9.4     Lab Results  Component Value Date   CHOL 182 08/03/2023   HDL 43 08/03/2023   LDLCALC 119 (H) 08/03/2023   TRIG 110 08/03/2023   CHOLHDL 4.2 08/03/2023    Medications Reviewed Today     Reviewed by Brinda Lorain SQUIBB, RPH-CPP (Pharmacist) on 04/05/24 at 1603  Med List Status: <None>   Medication Order Taking? Sig Documenting Provider Last Dose Status Informant  busPIRone  (BUSPAR ) 5 MG tablet [id number] Yes Take 1 tablet (5 mg total) by mouth 3 (three) times daily. Edgardo Pontiff, DO  Active   Dulaglutide  (TRULICITY ) 1.5 MG/0.5ML SOAJ [id number]  Inject 1.5 mg into the skin once a week.  Patient not taking: Reported on 04/05/2024   Elnora Ip, MD  Active   Dulaglutide  (TRULICITY ) 3 MG/0.5ML EMMANUEL [id number] Yes Inject 3 mg into the skin once a week. Jeanelle Layman CROME, MD  Active   DULoxetine  (CYMBALTA ) 60 MG capsule [id number] Yes Take 1 capsule (60 mg total) by mouth daily. Norrine Sharper, MD  Active   empagliflozin  (JARDIANCE ) 10 MG TABS tablet [id number] Yes Take 1 tablet (10 mg total) by mouth daily before breakfast. Elnora Ip, MD  Active   gabapentin  (NEURONTIN ) 300 MG capsule [id number] Yes Take 1 capsule (300 mg total) by mouth 2 (two) times daily AND 2 capsules (600 mg total) at bedtime. Elnora Ip, MD  Active   glucose blood (ACCU-CHEK GUIDE TEST) test strip [id number] Yes Use as instructed to monitor blood sugar up to three times per day. Jeanelle Layman CROME, MD  Active   insulin  glargine (LANTUS ) 100 UNIT/ML Solostar Pen [id number] Yes Inject 42 Units into the  skin daily. Jeanelle Layman CROME, MD  Active   Insulin  Pen Needle (PEN NEEDLES) 32G X 4 MM MISC [id number] Yes Use with novolog  3 times daily and 1 time daily with trojeo Gomez-Caraballo, Maria, MD  Active   ipratropium (ATROVENT ) 0.06 % nasal spray [id number]  Place 2 sprays into both nostrils 4 (four) times daily. Babara, Amy V, PA-C  Active   Lancets Misc. (ACCU-CHEK FASTCLIX LANCET) KIT [id number]  Use to draw blood for daily blood sugar checks. Norrine Sharper, MD  Active   levothyroxine  (SYNTHROID ) 100 MCG tablet [id number] Yes Take 1 tablet (100 mcg total) by mouth daily. Fernand Prost, MD  Active   NOVOLOG  FLEXPEN 100 UNIT/ML FlexPen [id number] Yes Inject 15 Units into the skin 3 (three) times daily with meals.  Fernand Prost, MD  Active   rosuvastatin  (CRESTOR ) 20 MG tablet [id number] Yes Take 1 tablet (20 mg total) by mouth daily. Fernand Prost, MD  Active               Assessment/Plan:   Type 2 Diabetes: - Currently uncontrolled with most recent A1C of 8.0% above goal <7%, but much improved from 14%. Unfortunately, patient is having to switch from Moujaro to Trulicity  due to insurance change, and may lose some glycemic control. Will attempt to transition back to Mounjaro  once her Medicare starts in April. If additional glycemic control is needed at follow-up, consider titration of Jardiance  or restarting metformin.  - Last UACR June 2025 - 14 mg/g - Reviewed long term cardiovascular and renal outcomes of uncontrolled blood sugar - Reviewed goal A1c, goal fasting, and goal 2 hour post prandial glucose - Reviewed hypoglycemia management plan and the rule of 15 - Recommend to transition to Trulicity  1.5 mg weekly as previously instructed. Will send Trulicity  3 mg to have on file at the pharmacy for the following month.  - Recommend to continue Toujeo  U300 42 units daily. When current supply runs out, patient will need to switch to Lantus  42 units daily per insurance preference.  - Recommend to  continue Novolog  15 units TID with meals - Recommend to continue Jardiance  10 mg daily - Recommend to check glucose twice daily: fasting and 2-hr PPG. Counseled patient to bring glucometer or BG log to every appointment. Sent supplies to [facility] pharmacy for patient to fill on her Medicaid insurance. When patient has stable Medicare insurance (after April 2026), will revisit coverage of CGM. - Next A1C due 05/09/23     Written patient instructions provided. Patient verbalized understanding of treatment plan.    Follow Up Plan:  Pharmacist telephone 05/03/24 PCP clinic visit in 05/21/24   Lorain Baseman, PharmD [facility] [phone]   "

## 2024-04-05 NOTE — Telephone Encounter (Signed)
 Meds ordered this encounter  Medications   Lancets Misc. (ACCU-CHEK FASTCLIX LANCET) KIT    Sig: Use to draw blood for daily blood sugar checks.    Dispense:  1 kit    Refill:  3   Medications Discontinued During This Encounter  Medication Reason   Accu-Chek Softclix Lancets lancets    Ozell Kung MD 04/05/2024, 3:36 PM

## 2024-04-06 ENCOUNTER — Other Ambulatory Visit: Payer: Self-pay

## 2024-05-03 ENCOUNTER — Other Ambulatory Visit: Payer: Self-pay

## 2024-05-21 ENCOUNTER — Ambulatory Visit: Payer: Self-pay
